# Patient Record
Sex: Male | Born: 1980 | Race: Black or African American | Hispanic: No | Marital: Single | Smoking: Former smoker
Health system: Southern US, Community
[De-identification: ages and names within clinical notes are randomized; demographics above are authoritative.]

## PROBLEM LIST (undated history)

## (undated) DIAGNOSIS — F3110 Bipolar disorder, current episode manic without psychotic features, unspecified: Secondary | ICD-10-CM

## (undated) DIAGNOSIS — T7840XA Allergy, unspecified, initial encounter: Secondary | ICD-10-CM

## (undated) HISTORY — DX: Bipolar disorder, current episode manic without psychotic features, unspecified: F31.10

## (undated) HISTORY — DX: Allergy, unspecified, initial encounter: T78.40XA

---

## 2000-05-12 ENCOUNTER — Ambulatory Visit (HOSPITAL_COMMUNITY): Admission: RE | Admit: 2000-05-12 | Discharge: 2000-05-12 | Payer: Self-pay | Admitting: *Deleted

## 2004-03-11 ENCOUNTER — Inpatient Hospital Stay (HOSPITAL_COMMUNITY): Admission: RE | Admit: 2004-03-11 | Discharge: 2004-03-14 | Payer: Self-pay | Admitting: Psychiatry

## 2004-03-13 ENCOUNTER — Ambulatory Visit: Payer: Self-pay | Admitting: Psychiatry

## 2010-04-01 ENCOUNTER — Emergency Department (HOSPITAL_COMMUNITY)
Admission: EM | Admit: 2010-04-01 | Discharge: 2010-04-01 | Payer: Self-pay | Source: Home / Self Care | Admitting: Family Medicine

## 2010-06-07 ENCOUNTER — Encounter: Payer: Self-pay | Admitting: Psychiatry

## 2010-06-07 ENCOUNTER — Encounter: Payer: Self-pay | Admitting: Family Medicine

## 2010-10-03 NOTE — Discharge Summary (Signed)
NAMEDEANTHONY, MAULL NO.:  [id number]   MEDICAL RECORD NO.:  [id number]          PATIENT TYPE:  IPS   LOCATION:  0403                          FACILITY:  [facility]   PHYSICIAN:  Jeanice Lim, M.D. DATE OF BIRTH:  01/03/1981   DATE OF ADMISSION:  03/11/2004  DATE OF DISCHARGE:  03/14/2004                                 DISCHARGE SUMMARY   IDENTIFYING DATA:  This is a 30 year old single African-American male  voluntarily admitted with bizarre behavior, throwing away items without  asking, exercising and jogging at home, not been sleeping very well for 2-3  weeks with auditory hallucinations.  The patient had been working around the  clock and, again, exercising and jogging.   MEDICATIONS:  Paxil, Ativan, started day prior to admission.   ALLERGIES:  No known drug allergies.   PHYSICAL EXAMINATION:  Physical and neurological exam within normal limits.   LABORATORY DATA:  Routine admission labs within normal limits.   MENTAL STATUS EXAM:  Alert, cooperative, somewhat sleepy, casually dressed.  Poor eye contact.  Speech soft-spoken, nondeliberate.  Mood tired.  Affect  mild irritability and possible suspicious.  Thought processes mostly goal  directed, questionable paranoia, questionable auditory hallucinations and  possible grandiosity.  Cognitive aware of self and situation.  Short-term  memory was impaired.  Poor historian.  Judgment and insight were poor.   ADMISSION DIAGNOSES:   AXIS I:  1.  Psychotic disorder not otherwise specified.  2.  Rule out likely bipolar disorder, type 1, hypomanic to manic state, with      psychotic features.   AXIS II:  Deferred.   AXIS III:  None.   AXIS IV:  Mild to moderate stressors.   AXIS V:  30/65-70.   HOSPITAL COURSE:  The patient was admitted and ordered routine p.r.n.  medications and underwent further monitoring.  Was encouraged to participate  in individual, group and milieu therapy.  The patient was  evaluated and  given medication education.  Risk/benefit ratio and alternative treatments  regarding medications were reviewed and patient was stabilized on  medications.  The patient exhibited some thought-blocking.  Required CIRT  but no restraint.  P.r.n.'s for agitation were given two consecutive days.  Tried to break through door, unpredictable, psychotic, voices, decreased  sleep, mood swings, bizarre behavior.  MRI ordered and neurologic workup,  including B12, folate, TSH, HIV, RPR were all requested due to questionable  first onset of psychosis and mood instability.  Concern of patient being  able to be monitored and managed safely.  Was discussed with treatment team  and treatment team multidisciplined planning continued to work with patient.  The patient was difficult to redirect, threatening physically.  Was quite  threatening to physician as well as several staff members.  Kicked open  doors of unit and reported threatening if he was not to get MRI that had  been ordered immediately that he may break through the glass or permanently  disable a staff member.  The patient could not be reasoned with regarding  MRI.  Explained that this was not urgent.  It was more important to  stabilize him on medications regarding getting him out of the hospital  sooner and patient continued to escalate.  Therefore, after another  interdisciplinary treatment team meeting, it was agreed upon that it was in  the patient's best interest to be transferred to [facility] for a safe  setting for continued medication stabilization in a setting where he was  less likely to be injured and could be maintained due to his degree of  threatening behavior, risk of violence and unpredictable nature and mixed  setting, which was not ideal for this kind of presentation for safe  stabilization.  Therefore, he was transferred to [facility] for further  inpatient stabilization.   CONDITION ON DISCHARGE:  The  patient was discharged in unimproved condition,  to continue medications at the time of transfer for further evaluation at  [facility] and stabilization there.  Therefore, again, the patient was  discharged in unimproved condition.   DISCHARGE DIAGNOSES:   AXIS I:  1.  Psychotic disorder not otherwise specified.  2.  Rule out likely bipolar disorder, type 1, hypomanic to manic state, with      psychotic features.   AXIS II:  Deferred.   AXIS III:  None.   AXIS IV:  Mild to moderate stressors.   AXIS V:  30/65-70.     Jame   JEM/MEDQ  D:  04/13/2004  T:  04/14/2004  Job:  [id number]

## 2010-10-03 NOTE — Procedures (Signed)
[facility]  Patient:    Scott Browning, Scott Browning                      MRN: [id number] Proc. Date: 05/12/00 Adm. Date:  [id number] Attending:  Sabino Gasser                           Procedure Report  PROCEDURE PERFORMED:  Flexible sigmoidoscopy.  ENDOSCOPIST:  Sabino Gasser, M.D.  INDICATIONS:  Rectal bleeding.  ANESTHESIA:  None given at patients request.  DESCRIPTION OF PROCEDURE:  With the patient in the left lateral decubitus position, a rectal exam was performed.  Subsequently, the Olympus videoscopic colonoscope was inserted into the rectum and passed under direct vision to approximately 25 cm from the anal verge at which point the stool that we encountered became impassible, and we stopped.  A photograph was taken.  From this point, the endoscope was slowly withdrawn taking circumferential views of the entire rectal mucosa.  The endoscope was then pulled back into the anal canal area and placed in retroflexion view.  The rectum from above still appeared negative grossly.  The endoscope was straightened and withdrawn through the anal canal which was unremarkable.  The patients vital signs and pulse oximeter remained stable.  The patient tolerated the procedure well without apparent complications.  FINDINGS:  Poor prep precludes thorough exam, but no gross lesion seen.  No hemorrhoids, fissures or other abnormalities at this time.  I told the patient if he had recurrence of bleeding to contact me for potential colonoscopy or exam under sedation with better prep. DD:  05/12/00 TD:  05/12/00 Job: [id number]

## 2012-10-19 ENCOUNTER — Ambulatory Visit (INDEPENDENT_AMBULATORY_CARE_PROVIDER_SITE_OTHER): Payer: BC Managed Care – PPO | Admitting: Physician Assistant

## 2012-10-19 VITALS — BP 120/74 | HR 62 | Temp 98.1°F | Resp 16 | Ht 75.75 in | Wt 221.6 lb

## 2012-10-19 DIAGNOSIS — J019 Acute sinusitis, unspecified: Secondary | ICD-10-CM

## 2012-10-19 DIAGNOSIS — J302 Other seasonal allergic rhinitis: Secondary | ICD-10-CM

## 2012-10-19 DIAGNOSIS — J309 Allergic rhinitis, unspecified: Secondary | ICD-10-CM

## 2012-10-19 MED ORDER — GUAIFENESIN ER 1200 MG PO TB12: 1200.0000 mg | ORAL_TABLET | Freq: Two times a day (BID) | ORAL | Status: DC

## 2012-10-19 MED ORDER — FLUTICASONE PROPIONATE 50 MCG/ACT NA SUSP: 2.0000 | Freq: Every day | NASAL | Status: DC

## 2012-10-19 MED ORDER — AMOXICILLIN 875 MG PO TABS: 1750.0000 mg | ORAL_TABLET | Freq: Two times a day (BID) | ORAL | Status: DC

## 2012-10-19 NOTE — Progress Notes (Signed)
  Subjective:    Patient ID: Scott Browning, male    DOB: 1980-07-06, 32 y.o.   MRN: [id number]  HPI  Presents with nasal congestion, sore throat, cough, sinus pressure x 1.5 weeks and subjective fever and body aches x 2 days. Reports coughing up yellow sputum, which he also blows out of his nose. Denies blood in sputum. Patient reports history of seasonal allergies for the past 4 years but this feels slightly different. Benadryl provided some relief. Denies nausea. Does not take regular allergy medications.  Pt is interested in using nose cones from his research on the internet and wants to see if they are ok.   Review of Systems Denies: headache, vision changes, SOB, chest pain, abdominal pain, urinary symptoms.     Objective:   Physical Exam  General: WDWN male, no apparent distress HEENT: normocephalic, atraumatic, PERLA, conjunctiva clear, TMs clear and visible bony landmarks, turbinates are erythematous and edematous with visible clear discharge, uvula midline, posterior palate is erythematous without edema, neck supple, no lymphadenopathy but submandibular glands are tender to palpation, maxillary sinuses are mildly tender to palpation.   Resp: clear to auscultation in all fields bilaterally without rales/rhonchi/wheezes  Cardiac: RRR, S1S2 appreciated, no murmurs/rubs/gallops Extremities: moves all limbs spontaneously  Skin: no rash      Assessment & Plan:   Acute sinus infection - Plan: Guaifenesin 1200 MG TB12, amoxicillin (AMOXIL) 875 MG tablet  Seasonal allergies - Plan: fluticasone (FLONASE) 50 MCG/ACT nasal spray  I suspect patient has untreated seasonal allergies and therefore developed a sinus infection due to a viral URI.  Pt educated on use of nasal spray for seasonal allergies.  Take entire antibiotic as directed.  Fluids and hydration.  It is ok to use the nasal cone if patient desire.

## 2012-10-19 NOTE — Progress Notes (Signed)
   [address] [place] [address]   Phone [phone]  Subjective:    Patient ID: Scott Browning, male    DOB: 1981-03-25, 32 y.o.   MRN: [id number]  HPI    Review of Systems     Objective:   Physical Exam        Assessment & Plan:  I was directly involved with the patients care and examined and agreed with the assessment and plan.

## 2012-10-19 NOTE — Patient Instructions (Addendum)
Take medications as directed.  Finish entire course of antibiotics as directed.  Drink lots of fluids and stay hydrated.  Return for follow up if you do not improve in 7 days.

## 2012-10-20 ENCOUNTER — Encounter: Payer: Self-pay | Admitting: Physician Assistant

## 2012-10-26 ENCOUNTER — Ambulatory Visit (INDEPENDENT_AMBULATORY_CARE_PROVIDER_SITE_OTHER): Payer: BC Managed Care – PPO | Admitting: Family Medicine

## 2012-10-26 VITALS — BP 132/90 | HR 70 | Temp 98.2°F | Resp 16 | Ht 75.25 in | Wt 214.0 lb

## 2012-10-26 DIAGNOSIS — J019 Acute sinusitis, unspecified: Secondary | ICD-10-CM

## 2012-10-26 DIAGNOSIS — J029 Acute pharyngitis, unspecified: Secondary | ICD-10-CM

## 2012-10-26 DIAGNOSIS — Z113 Encounter for screening for infections with a predominantly sexual mode of transmission: Secondary | ICD-10-CM

## 2012-10-26 MED ORDER — AMOXICILLIN-POT CLAVULANATE 875-125 MG PO TABS: 1.0000 | ORAL_TABLET | Freq: Two times a day (BID) | ORAL | Status: DC

## 2012-10-26 NOTE — Patient Instructions (Signed)

## 2012-10-26 NOTE — Progress Notes (Signed)
 [facility]:  Office Visit  Chief Complaint:  Chief Complaint  Patient presents with  . Follow-up    sinus infection 10/19/12    HPI: Scott Browning is a 32 y.o. male who complains of :  1. Recheck for sinus infection, feels better but still has lump in his throat on left side and also ears feel like water is trickling down it and some nasal congestion. Was see on 6/4 and given amox 875 mg BID x 10 days and flonase. Taking otc benadryl. He has been inside since but would like to go running today and wants to be completely better. No SOB, wheezing.  2. STD screening. History of gonorrhea. Last time he had a bump in his throat and felt this way he was screened for STDs and had gonorrhea, he has not been sexually active either orally or rectally for 1.5 years now.    HPI from prior OV 10/19/12 Presents with nasal congestion, sore throat, cough, sinus pressure x 1.5 weeks and subjective fever and body aches x 2 days. Reports coughing up yellow sputum, which he also blows out of his nose. Denies blood in sputum. Patient reports history of seasonal allergies for the past 4 years but this feels slightly different. Benadryl provided some relief. Denies nausea. Does not take regular allergy medications. Pt is interested in using nose cones from his research on the internet and wants to see if they are ok.   Past Medical History  Diagnosis Date  . Allergy    No past surgical history on file. History   Social History  . Marital Status: Single    Spouse Name: N/A    Number of Children: N/A  . Years of Education: N/A   Social History Main Topics  . Smoking status: Never Smoker   . Smokeless tobacco: None  . Alcohol Use: None  . Drug Use: None  . Sexually Active: None   Other Topics Concern  . None   Social History Narrative  . None   Family History  Problem Relation Age of Onset  . Alzheimer's disease Maternal Grandfather    Allergies  Allergen Reactions  .  Lamictal (Lamotrigine) Rash   Prior to Admission medications   Medication Sig Start Date End Date Taking? Authorizing Provider  diphenhydrAMINE (SOMINEX) 25 MG tablet Take 25 mg by mouth at bedtime as needed for sleep.   Yes Historical Provider, MD  fluticasone (FLONASE) 50 MCG/ACT nasal spray Place 2 sprays into the nose daily. 10/19/12  Yes Morrell Riddle, PA-C  Guaifenesin 1200 MG TB12 Take 1 tablet (1,200 mg total) by mouth 2 (two) times daily. 10/19/12  Yes Morrell Riddle, PA-C  amoxicillin (AMOXIL) 875 MG tablet Take 2 tablets (1,750 mg total) by mouth 2 (two) times daily. 10/19/12   Morrell Riddle, PA-C     ROS: The patient denies fevers, chills, night sweats, unintentional weight loss, chest pain, palpitations, wheezing, dyspnea on exertion, nausea, vomiting, abdominal pain, dysuria, hematuria, melena, numbness, weakness, or tingling.   All other systems have been reviewed and were otherwise negative with the exception of those mentioned in the HPI and as above.    PHYSICAL EXAM: Filed Vitals:   10/26/12 1155  BP: 132/90  Pulse: 70  Temp: 98.2 F (36.8 C)  Resp: 16   Filed Vitals:   10/26/12 1155  Height: 6' 3.25" (1.911 m)  Weight: 214 lb (97.07 kg)   Body mass index is 26.58 kg/(m^2).  General: Alert, no acute distress HEENT:  Normocephalic, atraumatic, oropharynx patent. + boggy nares. + erythema.  Cardiovascular:  Regular rate and rhythm, no rubs murmurs or gallops.  No Carotid bruits, radial pulse intact. No pedal edema.  Respiratory: Clear to auscultation bilaterally.  No wheezes, rales, or rhonchi.  No cyanosis, no use of accessory musculature GI: No organomegaly, abdomen is soft and non-tender, positive bowel sounds.  No masses. Skin: No rashes. Neurologic: Facial musculature symmetric. Psychiatric: Patient is appropriate throughout our interaction. Lymphatic: + small left mobile cervical lymphadenopathy Musculoskeletal: Gait intact.   LABS: No results found for  this or any previous visit.   EKG/XRAY:   Primary read interpreted by Dr. Conley Rolls at [facility].   ASSESSMENT/PLAN: Encounter Diagnoses  Name Primary?  . Screen for STD (sexually transmitted disease) Yes  . Acute sinusitis   . Acute pharyngitis     C/w meds Long discussion about abx use and risk and benefits If no improvement with sxs treatment then take Augmentin DC Bendaryl, take Calritin D/Zyrtec D G/C pending F/u prn   ,  PHUONG, DO 10/26/2012 1:24 PM

## 2012-10-27 LAB — GC/CHLAMYDIA PROBE AMP
CT Probe RNA: NEGATIVE
GC Probe RNA: NEGATIVE

## 2012-10-31 ENCOUNTER — Telehealth: Payer: Self-pay

## 2012-10-31 ENCOUNTER — Ambulatory Visit (INDEPENDENT_AMBULATORY_CARE_PROVIDER_SITE_OTHER): Payer: BC Managed Care – PPO | Admitting: Family Medicine

## 2012-10-31 VITALS — BP 120/83 | HR 80 | Temp 99.3°F | Resp 18 | Wt 216.0 lb

## 2012-10-31 DIAGNOSIS — L0390 Cellulitis, unspecified: Secondary | ICD-10-CM

## 2012-10-31 DIAGNOSIS — L0291 Cutaneous abscess, unspecified: Secondary | ICD-10-CM

## 2012-10-31 LAB — POCT CBC
Granulocyte percent: 74.1 %G (ref 37–80)
HCT, POC: 46.8 % (ref 43.5–53.7)
Hemoglobin: 15 g/dL (ref 14.1–18.1)
Lymph, poc: 2 (ref 0.6–3.4)
MCH, POC: 29.5 pg (ref 27–31.2)
MCHC: 32.1 g/dL (ref 31.8–35.4)
MCV: 92.2 fL (ref 80–97)
MID (cbc): 0.7 (ref 0–0.9)
MPV: 10 fL (ref 0–99.8)
POC Granulocyte: 7.7 — AB (ref 2–6.9)
POC LYMPH PERCENT: 19.3 %L (ref 10–50)
POC MID %: 6.6 %M (ref 0–12)
Platelet Count, POC: 185 10*3/uL (ref 142–424)
RBC: 5.08 M/uL (ref 4.69–6.13)
RDW, POC: 13.6 %
WBC: 10.4 10*3/uL — AB (ref 4.6–10.2)

## 2012-10-31 MED ORDER — CEPHALEXIN 500 MG PO CAPS: 500.0000 mg | ORAL_CAPSULE | Freq: Four times a day (QID) | ORAL | Status: DC

## 2012-10-31 MED ORDER — CEFTRIAXONE SODIUM 1 G IJ SOLR
1.0000 g | Freq: Once | INTRAMUSCULAR | Status: AC
  Administered 2012-10-31: 1 g via INTRAMUSCULAR

## 2012-10-31 NOTE — Patient Instructions (Addendum)
Cellulitis Cellulitis is an infection of the skin and the tissue beneath it. The infected area is usually red and tender. Cellulitis occurs most often in the arms and lower legs.  CAUSES  Cellulitis is caused by bacteria that enter the skin through cracks or cuts in the skin. The most common types of bacteria that cause cellulitis are Staphylococcus and Streptococcus. SYMPTOMS   Redness and warmth.  Swelling.  Tenderness or pain.  Fever. DIAGNOSIS  Your caregiver can usually determine what is wrong based on a physical exam. Blood tests may also be done. TREATMENT  Treatment usually involves taking an antibiotic medicine. HOME CARE INSTRUCTIONS   Take your antibiotics as directed. Finish them even if you start to feel better.  Keep the infected arm or leg elevated to reduce swelling.  Apply a warm cloth to the affected area up to 4 times per day to relieve pain.  Only take over-the-counter or prescription medicines for pain, discomfort, or fever as directed by your caregiver.  Keep all follow-up appointments as directed by your caregiver. SEEK MEDICAL CARE IF:   You notice red streaks coming from the infected area.  Your red area gets larger or turns dark in color.  Your bone or joint underneath the infected area becomes painful after the skin has healed.  Your infection returns in the same area or another area.  You notice a swollen bump in the infected area.  You develop new symptoms. SEEK IMMEDIATE MEDICAL CARE IF:   You have a fever.  You feel very sleepy.  You develop vomiting or diarrhea.  You have a general ill feeling (malaise) with muscle aches and pains. MAKE SURE YOU:   Understand these instructions.  Will watch your condition.  Will get help right away if you are not doing well or get worse. Document Released: 02/11/2005 Document Revised: 11/03/2011 Document Reviewed: 07/20/2011 [employer] Patient Information 2014 [employer].  

## 2012-10-31 NOTE — Progress Notes (Signed)
[facility]:  Office Visit  Chief Complaint:  Chief Complaint  Patient presents with  . Foot Swelling    HPI: Scott Browning is a 32 y.o. male who complains of  Left foot pain starting 2 days ago, he had a cut which he got from [employer] while he was shopping 3 days ago after something fell at the cash register and cut him. He got the cut on Saturday. He then got into a hot tub. 2 days after that his foot started hurting and he had foot swelling.  He has had fevers and chills. 2-3 days after he got his cut he went into the hot tub. Of pertinent interest he is on Augmentin for residual sinusitis sxs. He also played tennis but denies injuring himself. No history of gout.  Past Medical History  Diagnosis Date  . Allergy    No past surgical history on file. History   Social History  . Marital Status: Single    Spouse Name: N/A    Number of Children: N/A  . Years of Education: N/A   Social History Main Topics  . Smoking status: Never Smoker   . Smokeless tobacco: Not on file  . Alcohol Use: Not on file  . Drug Use: Not on file  . Sexually Active: Not on file   Other Topics Concern  . Not on file   Social History Narrative  . No narrative on file   Family History  Problem Relation Age of Onset  . Alzheimer's disease Maternal Grandfather    Allergies  Allergen Reactions  . Lamictal (Lamotrigine) Rash   Prior to Admission medications   Medication Sig Start Date End Date Taking? Authorizing Provider  amoxicillin-clavulanate (AUGMENTIN) 875-125 MG per tablet Take 1 tablet by mouth 2 (two) times daily. 10/26/12  Yes Mayra Brahm P Shyra Emile, DO  fluticasone (FLONASE) 50 MCG/ACT nasal spray Place 2 sprays into the nose daily. 10/19/12  Yes Morrell Riddle, PA-C  diphenhydrAMINE (SOMINEX) 25 MG tablet Take 25 mg by mouth at bedtime as needed for sleep.    Historical Provider, MD  Guaifenesin 1200 MG TB12 Take 1 tablet (1,200 mg total) by mouth 2 (two) times daily. 10/19/12   Morrell Riddle, PA-C     ROS: The patient denies night sweats, unintentional weight loss, chest pain, palpitations, wheezing, dyspnea on exertion, nausea, vomiting, abdominal pain, dysuria, hematuria, melena, numbness, weakness, or tingling.   All other systems have been reviewed and were otherwise negative with the exception of those mentioned in the HPI and as above.    PHYSICAL EXAM: Filed Vitals:   10/31/12 1157  BP: 120/83  Pulse: 80  Temp: 99.3 F (37.4 C)  Resp: 18   Filed Vitals:   10/31/12 1157  Weight: 216 lb (97.977 kg)   Body mass index is 26.83 kg/(m^2).  General: Alert, no acute distress HEENT:  Normocephalic, atraumatic, oropharynx patent.  Cardiovascular:  Regular rate and rhythm, no rubs murmurs or gallops.  No Carotid bruits, radial pulse intact. No pedal edema.  Respiratory: Clear to auscultation bilaterally.  No wheezes, rales, or rhonchi.  No cyanosis, no use of accessory musculature GI: No organomegaly, abdomen is soft and non-tender, positive bowel sounds.  No masses. Skin: No rashes. Neurologic: Facial musculature symmetric. Psychiatric: Patient is appropriate throughout our interaction. Lymphatic: No cervical lymphadenopathy Musculoskeletal: limp + left foot swelling, redness, warmth. + laceration healed at achilles tendon, swelling and redness does not really go past ankle, starts  from dorsum to top of ankle.  + tibial artery, good cap reill, 5/5 stregnth, full ROm but pain with walking   LABS: Results for orders placed in visit on 10/31/12  POCT CBC      Result Value Range   WBC 10.4 (*) 4.6 - 10.2 K/uL   Lymph, poc 2.0  0.6 - 3.4   POC LYMPH PERCENT 19.3  10 - 50 %L   MID (cbc) 0.7  0 - 0.9   POC MID % 6.6  0 - 12 %M   POC Granulocyte 7.7 (*) 2 - 6.9   Granulocyte percent 74.1  37 - 80 %G   RBC 5.08  4.69 - 6.13 M/uL   Hemoglobin 15.0  14.1 - 18.1 g/dL   HCT, POC 16.1  09.6 - 53.7 %   MCV 92.2  80 - 97 fL   MCH, POC 29.5  27 - 31.2 pg   MCHC  32.1  31.8 - 35.4 g/dL   RDW, POC 04.5     Platelet Count, POC 185  142 - 424 K/uL   MPV 10.0  0 - 99.8 fL     EKG/XRAY:   Primary read interpreted by Dr. Conley Rolls at [facility].   ASSESSMENT/PLAN: Encounter Diagnosis  Name Primary?  . Cellulitis Yes   I do not think this is gout, I also don;t think he had ay sprains or strain from tennis Most likely a cellulitis that he got from cut from back of heel and then he soaked it in the hot tub He is not worse off since he was already on Augmentin for sinusitis sxs Was given Rocephin 1 gram IM in office DC Augmentin Start Keflex 500 mg QID x 10 days Work note for 3 days off, RTW on 6/18  F/u by phone on Wednesday AM     Azaleah Usman PHUONG, DO 10/31/2012 1:13 PM

## 2012-10-31 NOTE — Telephone Encounter (Signed)
PT STATES HE HAD TESTS DONE ON THE 11TH AND HASN'T HEARD FROM ANYONE. PLEASE CALL [phone] WAS SEEN BY DR LE

## 2012-11-01 NOTE — Telephone Encounter (Signed)
Advised pt of results. No questions/concerns.

## 2012-11-02 ENCOUNTER — Ambulatory Visit (INDEPENDENT_AMBULATORY_CARE_PROVIDER_SITE_OTHER): Payer: BC Managed Care – PPO | Admitting: Family Medicine

## 2012-11-02 ENCOUNTER — Ambulatory Visit: Payer: BC Managed Care – PPO

## 2012-11-02 ENCOUNTER — Telehealth: Payer: Self-pay | Admitting: Family Medicine

## 2012-11-02 VITALS — BP 122/78 | HR 66 | Temp 97.9°F | Resp 16 | Ht 75.25 in | Wt 218.0 lb

## 2012-11-02 DIAGNOSIS — M25572 Pain in left ankle and joints of left foot: Secondary | ICD-10-CM

## 2012-11-02 DIAGNOSIS — M25579 Pain in unspecified ankle and joints of unspecified foot: Secondary | ICD-10-CM

## 2012-11-02 DIAGNOSIS — L03116 Cellulitis of left lower limb: Secondary | ICD-10-CM

## 2012-11-02 DIAGNOSIS — L02619 Cutaneous abscess of unspecified foot: Secondary | ICD-10-CM

## 2012-11-02 LAB — POCT CBC
Granulocyte percent: 60.8 % (ref 37–80)
HCT, POC: 42.2 % — AB (ref 43.5–53.7)
Hemoglobin: 13.4 g/dL — AB (ref 14.1–18.1)
Lymph, poc: 1.9 (ref 0.6–3.4)
MCH, POC: 29.1 pg (ref 27–31.2)
MCHC: 31.8 g/dL (ref 31.8–35.4)
MCV: 91.8 fL (ref 80–97)
MID (cbc): 0.5 (ref 0–0.9)
MPV: 9.4 fL (ref 0–99.8)
POC Granulocyte: 3.6 (ref 2–6.9)
POC LYMPH PERCENT: 31.1 %L (ref 10–50)
POC MID %: 8.1 % (ref 0–12)
Platelet Count, POC: 208 10*3/uL (ref 142–424)
RBC: 4.6 M/uL — AB (ref 4.69–6.13)
RDW, POC: 13.6 %
WBC: 6 10*3/uL (ref 4.6–10.2)

## 2012-11-02 LAB — POCT SEDIMENTATION RATE: POCT SED RATE: 45 mm/hr — AB (ref 0–22)

## 2012-11-02 LAB — URIC ACID: Uric Acid, Serum: 5.1 mg/dL (ref 4.0–7.8)

## 2012-11-02 NOTE — Progress Notes (Signed)
[facility]:  Office Visit  Chief Complaint:  Chief Complaint  Patient presents with  . Follow-up    cellulitis Lt foot    HPI: Scott Browning is a 32 y.o. male who complains of  Here for recheck of left foot, not better. On 2 rounds of abx Augmentin, then got rocephin and keflex for last 48 hrs and not better.  No fevers, chills. Same sxs but slightly more swollen because has been using it more and being on his feet. He had errands to do.   Prior HPI: Scott Browning is a 32 y.o. male who complains of Left foot pain starting 2 days ago, he had a cut which he got from [employer] while he was shopping 3 days ago after something fell at the cash register and cut him. He got the cut on Saturday. He then got into a hot tub. 2 days after that his foot started hurting and he had foot swelling.  He has had fevers and chills. 2-3 days after he got his cut he went into the hot tub. Of pertinent interest he is on Augmentin for residual sinusitis sxs. He also played tennis but denies injuring himself. No history of gout.   Past Medical History  Diagnosis Date  . Allergy    No past surgical history on file. History   Social History  . Marital Status: Single    Spouse Name: N/A    Number of Children: N/A  . Years of Education: N/A   Social History Main Topics  . Smoking status: Never Smoker   . Smokeless tobacco: None  . Alcohol Use: None  . Drug Use: None  . Sexually Active: None   Other Topics Concern  . None   Social History Narrative  . None   Family History  Problem Relation Age of Onset  . Alzheimer's disease Maternal Grandfather    Allergies  Allergen Reactions  . Lamictal (Lamotrigine) Rash   Prior to Admission medications   Medication Sig Start Date End Date Taking? Authorizing Provider  cephALEXin (KEFLEX) 500 MG capsule Take 1 capsule (500 mg total) by mouth 4 (four) times daily. 10/31/12  Yes Thao P Le, DO  diphenhydrAMINE (SOMINEX) 25 MG tablet  Take 25 mg by mouth at bedtime as needed for sleep.    Historical Provider, MD  fluticasone (FLONASE) 50 MCG/ACT nasal spray Place 2 sprays into the nose daily. 10/19/12   Morrell Riddle, PA-C  Guaifenesin 1200 MG TB12 Take 1 tablet (1,200 mg total) by mouth 2 (two) times daily. 10/19/12   Morrell Riddle, PA-C     ROS: The patient denies fevers, chills, night sweats, unintentional weight loss, chest pain, palpitations, wheezing, dyspnea on exertion, nausea, vomiting, abdominal pain, dysuria, hematuria, melena, numbness, weakness, or tingling.   All other systems have been reviewed and were otherwise negative with the exception of those mentioned in the HPI and as above.    PHYSICAL EXAM: Filed Vitals:   11/02/12 1021  BP: 122/78  Pulse: 66  Temp: 97.9 F (36.6 C)  Resp: 16   Filed Vitals:   11/02/12 1021  Height: 6' 3.25" (1.911 m)  Weight: 218 lb (98.884 kg)   Body mass index is 27.08 kg/(m^2).  General: Alert, no acute distress HEENT:  Normocephalic, atraumatic, oropharynx patent.  Cardiovascular:  Regular rate and rhythm, no rubs murmurs or gallops.  No Carotid bruits, radial pulse intact. No pedal edema.  Respiratory: Clear to auscultation bilaterally.  No wheezes, rales, or rhonchi.  No cyanosis, no use of accessory musculature GI: No organomegaly, abdomen is soft and non-tender, positive bowel sounds.  No masses. Skin: No rashes. Neurologic: Facial musculature symmetric. Psychiatric: Patient is appropriate throughout our interaction. Lymphatic: No cervical lymphadenopathy Musculoskeletal: Gait intact. + foot swelling left latera and dorsum of foot, and redness, and warmth, + DP Good ROM, 5/5 strength  LABS: Results for orders placed in visit on 11/02/12  POCT CBC      Result Value Range   WBC 6.0  4.6 - 10.2 K/uL   Lymph, poc 1.9  0.6 - 3.4   POC LYMPH PERCENT 31.1  10 - 50 %L   MID (cbc) 0.5  0 - 0.9   POC MID % 8.1  0 - 12 %M   POC Granulocyte 3.6  2 - 6.9    Granulocyte percent 60.8  37 - 80 %G   RBC 4.60 (*) 4.69 - 6.13 M/uL   Hemoglobin 13.4 (*) 14.1 - 18.1 g/dL   HCT, POC 95.6 (*) 21.3 - 53.7 %   MCV 91.8  80 - 97 fL   MCH, POC 29.1  27 - 31.2 pg   MCHC 31.8  31.8 - 35.4 g/dL   RDW, POC 08.6     Platelet Count, POC 208  142 - 424 K/uL   MPV 9.4  0 - 99.8 fL     EKG/XRAY:   Primary read interpreted by Dr. Conley Rolls at [facility]. No fractures or dislocation of ankle or foot + soft tissue swelling  ASSESSMENT/PLAN: Encounter Diagnoses  Name Primary?  . Cellulitis of foot, left Yes  . Pain in joint, ankle and foot, left    ESR, uric acid pending Xrays are normal Will wait for another 24 hrs since no better but not worse F/u by phone in 1 day Elevate, warm compresses Go to ER prn   LE, THAO PHUONG, DO 11/02/2012 11:41 AM

## 2012-11-02 NOTE — Telephone Encounter (Signed)
Spoke to patient about labs and xray results

## 2012-11-23 ENCOUNTER — Ambulatory Visit (INDEPENDENT_AMBULATORY_CARE_PROVIDER_SITE_OTHER): Payer: BC Managed Care – PPO | Admitting: Family Medicine

## 2012-11-23 VITALS — BP 115/70 | HR 56 | Temp 98.8°F | Resp 16 | Ht 75.0 in | Wt 223.0 lb

## 2012-11-23 DIAGNOSIS — L0291 Cutaneous abscess, unspecified: Secondary | ICD-10-CM

## 2012-11-23 DIAGNOSIS — M79609 Pain in unspecified limb: Secondary | ICD-10-CM

## 2012-11-23 DIAGNOSIS — M79675 Pain in left toe(s): Secondary | ICD-10-CM

## 2012-11-23 DIAGNOSIS — L0390 Cellulitis, unspecified: Secondary | ICD-10-CM

## 2012-11-23 MED ORDER — INDOMETHACIN 50 MG PO CAPS: 50.0000 mg | ORAL_CAPSULE | Freq: Two times a day (BID) | ORAL | Status: DC

## 2012-11-23 NOTE — Progress Notes (Signed)
[facility]:  Office Visit  Chief Complaint:  Chief Complaint  Patient presents with  . Follow-up    left foot, cellulitis    HPI: Scott Browning is a 32 y.o. male who complains of: 1. Here for cellulitis recheck. Doing better. Has some skin peeling on his foot.He was on amoxacillin and then followed by Keflex.  2. He has a 1 1/2 week history of toe pain at the fist MTP of left great toe. H/o gout. Pain and especially when he is sleeping. No new footwear. No redness, warmth, fevers.   Past Medical History  Diagnosis Date  . Allergy    No past surgical history on file. History   Social History  . Marital Status: Single    Spouse Name: N/A    Number of Children: N/A  . Years of Education: N/A   Social History Main Topics  . Smoking status: Never Smoker   . Smokeless tobacco: Not on file  . Alcohol Use: Not on file  . Drug Use: Not on file  . Sexually Active: Not on file   Other Topics Concern  . Not on file   Social History Narrative  . No narrative on file   Family History  Problem Relation Age of Onset  . Alzheimer's disease Maternal Grandfather    Allergies  Allergen Reactions  . Lamictal (Lamotrigine) Rash   Prior to Admission medications   Medication Sig Start Date End Date Taking? Authorizing Provider  fluticasone (FLONASE) 50 MCG/ACT nasal spray Place 2 sprays into the nose daily. 10/19/12  Yes Morrell Riddle, PA-C  cephALEXin (KEFLEX) 500 MG capsule Take 1 capsule (500 mg total) by mouth 4 (four) times daily. 10/31/12   Sheriff Rodenberg P Anselmo Reihl, DO  diphenhydrAMINE (SOMINEX) 25 MG tablet Take 25 mg by mouth at bedtime as needed for sleep.    Historical Provider, MD  Guaifenesin 1200 MG TB12 Take 1 tablet (1,200 mg total) by mouth 2 (two) times daily. 10/19/12   Morrell Riddle, PA-C     ROS: The patient denies fevers, chills, night sweats, unintentional weight loss, chest pain, palpitations, wheezing, dyspnea on exertion, nausea, vomiting, abdominal pain,  dysuria, hematuria, melena, numbness, weakness, or tingling.   All other systems have been reviewed and were otherwise negative with the exception of those mentioned in the HPI and as above.    PHYSICAL EXAM: Filed Vitals:   11/23/12 1156  BP: 115/70  Pulse: 56  Temp: 98.8 F (37.1 C)  Resp: 16   Filed Vitals:   11/23/12 1156  Height: 6\' 3"  (1.905 m)  Weight: 223 lb (101.152 kg)   Body mass index is 27.87 kg/(m^2).  General: Alert, no acute distress HEENT:  Normocephalic, atraumatic, oropharynx patent.  Cardiovascular:  Regular rate and rhythm, no rubs murmurs or gallops.  No Carotid bruits, radial pulse intact. No pedal edema.  Respiratory: Clear to auscultation bilaterally.  No wheezes, rales, or rhonchi.  No cyanosis, no use of accessory musculature GI: No organomegaly, abdomen is soft and non-tender, positive bowel sounds.  No masses. Skin: No rashes. Neurologic: Facial musculature symmetric. Psychiatric: Patient is appropriate throughout our interaction. Lymphatic: No cervical lymphadenopathy Musculoskeletal: Gait intact. Left big toe-tender at lateral MTP, full ROM, 5/5 stength, no warmth, minimal redness Cellulitis resolved, he does have some skin peeling but not fungal  LABS: Results for orders placed in visit on 11/02/12  URIC ACID      Result Value Range   Uric Acid,  Serum 5.1  4.0 - 7.8 mg/dL  POCT CBC      Result Value Range   WBC 6.0  4.6 - 10.2 K/uL   Lymph, poc 1.9  0.6 - 3.4   POC LYMPH PERCENT 31.1  10 - 50 %L   MID (cbc) 0.5  0 - 0.9   POC MID % 8.1  0 - 12 %M   POC Granulocyte 3.6  2 - 6.9   Granulocyte percent 60.8  37 - 80 %G   RBC 4.60 (*) 4.69 - 6.13 M/uL   Hemoglobin 13.4 (*) 14.1 - 18.1 g/dL   HCT, POC 41.3 (*) 24.4 - 53.7 %   MCV 91.8  80 - 97 fL   MCH, POC 29.1  27 - 31.2 pg   MCHC 31.8  31.8 - 35.4 g/dL   RDW, POC 01.0     Platelet Count, POC 208  142 - 424 K/uL   MPV 9.4  0 - 99.8 fL  POCT SEDIMENTATION RATE      Result Value Range    POCT SED RATE 45 (*) 0 - 22 mm/hr     EKG/XRAY:   Primary read interpreted by Dr. Conley Rolls at [facility].   ASSESSMENT/PLAN: Encounter Diagnoses  Name Primary?  . Toe pain, left Yes  . Cellulitis    Cellulitis improved, resolved Rx Indomethacine x 5 days for ? Gout vs just swelling from tight foot wear Will  get uric acid. F/u prn   Taliah Porche PHUONG, DO 11/23/2012 12:28 PM

## 2012-11-24 LAB — URIC ACID: Uric Acid, Serum: 5.9 mg/dL (ref 4.0–7.8)

## 2013-02-25 ENCOUNTER — Encounter: Payer: Self-pay | Admitting: Physician Assistant

## 2013-02-25 ENCOUNTER — Ambulatory Visit (INDEPENDENT_AMBULATORY_CARE_PROVIDER_SITE_OTHER): Payer: BC Managed Care – PPO | Admitting: Physician Assistant

## 2013-02-25 VITALS — BP 144/86 | HR 90 | Temp 99.2°F | Resp 20 | Ht 75.25 in | Wt 216.4 lb

## 2013-02-25 DIAGNOSIS — G4700 Insomnia, unspecified: Secondary | ICD-10-CM

## 2013-02-25 DIAGNOSIS — F319 Bipolar disorder, unspecified: Secondary | ICD-10-CM

## 2013-02-25 MED ORDER — ZOLPIDEM TARTRATE 10 MG PO TABS: 10.0000 mg | ORAL_TABLET | Freq: Every evening | ORAL | Status: DC | PRN

## 2013-02-25 NOTE — Progress Notes (Signed)
   [address], [place] [address]   Phone [phone]   Subjective:    Patient ID: Scott Browning, male    DOB: 07-09-1980, 32 y.o.   MRN: [id number]  HPI  Pt presents to clinic with the need for sleep aid.  He has h/o bioplar and was diagnosed in 2005 and put on medication but he was not interested in being on meds daily and he and his pyschiatrist decided that because his mania was infrequent and he did not like to be on medication that he could stop the meds.  Then in 2011 he started seeing Dr Inda Merlin during a manic episode and she started him on Lamictal but he had a terrible rash and he again got scared of meds.  At that time she put him on Ambien to use during his mania phases.  He has not used any since 2011 because he typically just deals with his mania which lasts only about 1.5-2 weeks and being tired because he can get a lot done.  He is concerned this time because he is starting a new job in 4 days and he has been manic for about 4 days and he is concerned that his focus will be decreased because of lack of sleep and he wants to make a good impression.  He does not suffer from depression symptoms when he is not manic and during his mania he does not do anything that could cause harm to himself.  His main symptoms is that he does not need sleep.  His last episode of mania was in June and he states that he gets them maybe 2x /year and they have decreased substantial since his diagnosis.   Review of Systems  Psychiatric/Behavioral: Positive for sleep disturbance. Negative for dysphoric mood.       Objective:   Physical Exam  Vitals reviewed. Constitutional: He is oriented to person, place, and time. He appears well-developed and well-nourished.  HENT:  Head: Normocephalic and atraumatic.  Right Ear: External ear normal.  Left Ear: External ear normal.  Pulmonary/Chest: Effort normal.  Neurological: He is alert and oriented to person, place, and time.  Skin: Skin is warm and  dry.  Psychiatric: He has a normal mood and affect. His behavior is normal. Judgment and thought content normal. His speech is rapid and/or pressured (slightly rapid but not pressured). Cognition and memory are normal.        Assessment & Plan:  Bipolar disorder, unspecified  Insomnia - Plan: zolpidem (AMBIEN) 10 MG tablet  D/w pt that I will give him #15 pills due to his new job - we discussed that this is not the treatment for mania associated with bipolar but due to his concerns regarding long term medications and his infrequent mild symptoms I will do it this time but if he should notice increase in symptoms or needing Ambien more frequently he will need a psychiatrist.  He voiced understanding.  Benny Lennert PA-C 02/25/2013 7:01 PM

## 2014-05-25 ENCOUNTER — Encounter: Payer: Self-pay | Admitting: Family Medicine

## 2014-05-25 ENCOUNTER — Ambulatory Visit (INDEPENDENT_AMBULATORY_CARE_PROVIDER_SITE_OTHER): Payer: BLUE CROSS/BLUE SHIELD | Admitting: Family Medicine

## 2014-05-25 VITALS — BP 143/85 | HR 89 | Temp 98.5°F | Resp 16 | Ht 75.5 in | Wt 238.8 lb

## 2014-05-25 DIAGNOSIS — J0100 Acute maxillary sinusitis, unspecified: Secondary | ICD-10-CM

## 2014-05-25 DIAGNOSIS — J302 Other seasonal allergic rhinitis: Secondary | ICD-10-CM

## 2014-05-25 MED ORDER — LORATADINE 10 MG PO TABS: 10.0000 mg | ORAL_TABLET | Freq: Every day | ORAL | Status: DC

## 2014-05-25 MED ORDER — FLUTICASONE PROPIONATE 50 MCG/ACT NA SUSP: 2.0000 | Freq: Every day | NASAL | Status: DC

## 2014-05-25 MED ORDER — AMOXICILLIN-POT CLAVULANATE 875-125 MG PO TABS: 1.0000 | ORAL_TABLET | Freq: Two times a day (BID) | ORAL | Status: DC

## 2014-05-25 NOTE — Progress Notes (Signed)
Subjective:    Patient ID: Scott Browning, male    DOB: 10/05/1980, 34 y.o.   MRN: [id number]  PCP: Georgianne FickAMACHANDRAN,AJITH, MD  Chief Complaint  Patient presents with  . Sinusitis    10 days   Patient Active Problem List   Diagnosis Date Noted  . Bipolar disorder, unspecified 02/25/2013   Prior to Admission medications   Not on File   Medications, allergies, past medical history, surgical history, family history, social history and problem list reviewed and updated.  HPI  [age] yom with no sig pmh presents with uri sx.  He has been having rhinorrhea past 2 wks. Purulent. Prod cough past couple wks with white/yellow sputum. Occasional blood in cough. His teeth have felt painful last couple days. Denies sore throat, abd pain, N/V, diarrhea. Denies HA. Denies otalgia. Denies fever, chills.   Was seen here 1.5 yrs ago with similar sx, diagnosed with sinusitis. Amox worked. Was told to start flonase/claririn-d for allergies.   Today he mentions he has had year round allergies past couple yrs, worse when seasons change. He is not taking the flonase or antihistamine.   Diag with sinus infx 2 yrs ago. Same sx coming back. Drainage, coughing up phlegm. Came in cuz teeth feel sensitive now past couple days. Same sx as last time had full blown sinus infx. He is around a smoker daily and feels like this irritates his throat.   Review of Systems No CP, SOB, fever, chills.     Objective:   Physical Exam  Constitutional: He is oriented to person, place, and time. He appears well-developed and well-nourished.  Non-toxic appearance. He does not have a sickly appearance. He does not appear ill. No distress.  BP 143/85 mmHg  Pulse 89  Temp(Src) 98.5 F (36.9 C) (Oral)  Resp 16  Ht 6' 3.5" (1.918 m)  Wt 238 lb 12.8 oz (108.319 kg)  BMI 29.44 kg/m2  SpO2 98%   HENT:  Right Ear: Tympanic membrane normal.  Left Ear: Tympanic membrane normal.  Nose: No mucosal edema or rhinorrhea. Right sinus  exhibits no maxillary sinus tenderness and no frontal sinus tenderness. Left sinus exhibits maxillary sinus tenderness. Left sinus exhibits no frontal sinus tenderness.  Mouth/Throat: Uvula is midline, oropharynx is clear and moist and mucous membranes are normal. No oropharyngeal exudate, posterior oropharyngeal edema, posterior oropharyngeal erythema or tonsillar abscesses.  Mild-mod TTP left maxillary sinus.   Eyes: Conjunctivae are normal.  Cardiovascular: Normal rate, regular rhythm and normal heart sounds.  Exam reveals no gallop.   No murmur heard. Pulmonary/Chest: Effort normal and breath sounds normal. No tachypnea. He has no decreased breath sounds. He has no wheezes. He has no rhonchi. He has no rales.  Lymphadenopathy:       Head (right side): No submental, no submandibular and no tonsillar adenopathy present.       Head (left side): No submental, no submandibular and no tonsillar adenopathy present.    He has no cervical adenopathy.  Neurological: He is alert and oriented to person, place, and time.      Assessment & Plan:   [age] yom with no sig pmh presents with uri sx.  Acute maxillary sinusitis, recurrence not specified - Plan: amoxicillin-clavulanate (AUGMENTIN) 875-125 MG per tablet --prod cough, rhinorrhea for 2 wks, ttp left maxillary sinus today --augmentin  Seasonal allergies - Plan: fluticasone (FLONASE) 50 MCG/ACT nasal spray, loratadine (CLARITIN) 10 MG tablet --could be contributing to sx --flonase instructions given --claritin --avoidance of cigarette  smoke  Donnajean Lopes, PA-C Physician Assistant-Certified [facility]  05/25/2014 10:32 AM

## 2014-05-25 NOTE — Patient Instructions (Signed)
Your symptoms are most likely due to your allergies. Please take the claritin once daily. Please use the flonase 2 sprays in each nostril once daily.  It would benefit you to try to stay away from irritating smoke and perfumes as much as possible.  We'll write for an antibiotic. If you don't feel better in a few days or feel yourself getting worse, please begin the antibiotic. Please return to clinic if you don't feel that the allergy medicines and antibiotic are helping.

## 2014-05-28 NOTE — Progress Notes (Signed)
Subjective:    Patient ID: Scott Browning, male    DOB: 10/05/1980, 34 y.o.   MRN: [id number]  PCP: Georgianne FickAMACHANDRAN,AJITH, MD  Chief Complaint  Patient presents with  . Sinusitis    10 days   Patient Active Problem List   Diagnosis Date Noted  . Bipolar disorder, unspecified 02/25/2013   Prior to Admission medications   Not on File   Medications, allergies, past medical history, surgical history, family history, social history and problem list reviewed and updated.  HPI  [age] yom with no sig pmh presents with uri sx.  He has been having rhinorrhea past 2 wks. Purulent. Prod cough past couple wks with white/yellow sputum. Occasional blood in cough. His teeth have felt painful last couple days. Denies sore throat, abd pain, N/V, diarrhea. Denies HA. Denies otalgia. Denies fever, chills.   Was seen here 1.5 yrs ago with similar sx, diagnosed with sinusitis. Amox worked. Was told to start flonase/claririn-d for allergies.   Today he mentions he has had year round allergies past couple yrs, worse when seasons change. He is not taking the flonase or antihistamine.   Diag with sinus infx 2 yrs ago. Same sx coming back. Drainage, coughing up phlegm. Came in cuz teeth feel sensitive now past couple days. Same sx as last time had full blown sinus infx. He is around a smoker daily and feels like this irritates his throat.   Review of Systems No CP, SOB, fever, chills.     Objective:   Physical Exam  Constitutional: He is oriented to person, place, and time. He appears well-developed and well-nourished.  Non-toxic appearance. He does not have a sickly appearance. He does not appear ill. No distress.  BP 143/85 mmHg  Pulse 89  Temp(Src) 98.5 F (36.9 C) (Oral)  Resp 16  Ht 6' 3.5" (1.918 m)  Wt 238 lb 12.8 oz (108.319 kg)  BMI 29.44 kg/m2  SpO2 98%   HENT:  Right Ear: Tympanic membrane normal.  Left Ear: Tympanic membrane normal.  Nose: No mucosal edema or rhinorrhea. Right sinus  exhibits no maxillary sinus tenderness and no frontal sinus tenderness. Left sinus exhibits maxillary sinus tenderness. Left sinus exhibits no frontal sinus tenderness.  Mouth/Throat: Uvula is midline, oropharynx is clear and moist and mucous membranes are normal. No oropharyngeal exudate, posterior oropharyngeal edema, posterior oropharyngeal erythema or tonsillar abscesses.  Mild-mod TTP left maxillary sinus.   Eyes: Conjunctivae are normal.  Cardiovascular: Normal rate, regular rhythm and normal heart sounds.  Exam reveals no gallop.   No murmur heard. Pulmonary/Chest: Effort normal and breath sounds normal. No tachypnea. He has no decreased breath sounds. He has no wheezes. He has no rhonchi. He has no rales.  Lymphadenopathy:       Head (right side): No submental, no submandibular and no tonsillar adenopathy present.       Head (left side): No submental, no submandibular and no tonsillar adenopathy present.    He has no cervical adenopathy.  Neurological: He is alert and oriented to person, place, and time.      Assessment & Plan:   [age] yom with no sig pmh presents with uri sx.  Acute maxillary sinusitis, recurrence not specified - Plan: amoxicillin-clavulanate (AUGMENTIN) 875-125 MG per tablet --prod cough, rhinorrhea for 2 wks, ttp left maxillary sinus today --augmentin  Seasonal allergies - Plan: fluticasone (FLONASE) 50 MCG/ACT nasal spray, loratadine (CLARITIN) 10 MG tablet --could be contributing to sx --flonase instructions given --claritin --avoidance of cigarette  smoke  Donnajean Lopesodd M. McVeigh, PA-C Physician Assistant-Certified [facility]  05/25/2014 10:32 AM     Agree with A/P . Patient was also seen and examined by me. Dr Conley RollsLe

## 2014-12-12 ENCOUNTER — Other Ambulatory Visit: Payer: Self-pay | Admitting: *Deleted

## 2014-12-12 DIAGNOSIS — I83893 Varicose veins of bilateral lower extremities with other complications: Secondary | ICD-10-CM

## 2014-12-16 ENCOUNTER — Ambulatory Visit (INDEPENDENT_AMBULATORY_CARE_PROVIDER_SITE_OTHER): Payer: BLUE CROSS/BLUE SHIELD | Admitting: Family Medicine

## 2014-12-16 VITALS — BP 108/66 | HR 54 | Temp 98.5°F | Resp 14 | Ht 76.0 in | Wt 237.6 lb

## 2014-12-16 DIAGNOSIS — Z113 Encounter for screening for infections with a predominantly sexual mode of transmission: Secondary | ICD-10-CM

## 2014-12-16 DIAGNOSIS — Z13220 Encounter for screening for lipoid disorders: Secondary | ICD-10-CM

## 2014-12-16 DIAGNOSIS — Z0000 Encounter for general adult medical examination without abnormal findings: Secondary | ICD-10-CM | POA: Diagnosis not present

## 2014-12-16 DIAGNOSIS — Z130 Encounter for screening for diseases of the blood and blood-forming organs and certain disorders involving the immune mechanism: Secondary | ICD-10-CM

## 2014-12-16 DIAGNOSIS — Z1329 Encounter for screening for other suspected endocrine disorder: Secondary | ICD-10-CM | POA: Diagnosis not present

## 2014-12-16 NOTE — Progress Notes (Signed)
Chief Complaint:  Chief Complaint  Patient presents with  . Annual Exam    HPI: Scott Browning is a 34 y.o. male who reports to [facility] today complaining of here for annual exam He is doing well, still works at [employer], he still works at [employer] on weekends Recently purchased house and is remodeling, not currently sexually active, he is gay and has not been with a partner in a long time, he has no hx of STDs Still running, watching what he eats, his allergies are doing ok right now He would like to get labs and STD screening only if insurance pays for it. He was told his annual is free.   Upon chart review he was hospitalized from 10/25-28 /2005 at [facility] behavioral [facility] and subsequently transferred to [facility] for a psychotic episode. He is curently not having any depression or anxiety  Past Medical History  Diagnosis Date  . Allergy    History reviewed. No pertinent past surgical history. History   Social History  . Marital Status: Single    Spouse Name: N/A  . Number of Children: N/A  . Years of Education: N/A   Social History Main Topics  . Smoking status: Never Smoker   . Smokeless tobacco: Not on file  . Alcohol Use: 1.8 oz/week    3 Glasses of wine per week  . Drug Use: No  . Sexual Activity: Not on file   Other Topics Concern  . None   Social History Narrative   Family History  Problem Relation Age of Onset  . Alzheimer's disease Maternal Grandfather    Allergies  Allergen Reactions  . Lamictal [Lamotrigine] Rash   Prior to Admission medications   Medication Sig Start Date End Date Taking? Authorizing Provider  loratadine (CLARITIN) 10 MG tablet Take 1 tablet (10 mg total) by mouth daily. 05/25/14  Yes Todd McVeigh, PA  amoxicillin-clavulanate (AUGMENTIN) 875-125 MG per tablet Take 1 tablet by mouth 2 (two) times daily. Patient not taking: Reported on 12/16/2014 05/25/14   Raelyn Ensign, PA  fluticasone [facility]) 50 MCG/ACT nasal spray  Place 2 sprays into both nostrils daily. Patient not taking: Reported on 12/16/2014 05/25/14   Raelyn Ensign, PA     ROS: The patient denies fevers, chills, night sweats, unintentional weight loss, chest pain, palpitations, wheezing, dyspnea on exertion, nausea, vomiting, abdominal pain, dysuria, hematuria, melena, numbness, weakness, or tingling.   All other systems have been reviewed and were otherwise negative with the exception of those mentioned in the HPI and as above.    PHYSICAL EXAM: Filed Vitals:   12/16/14 0919  BP: 108/66  Pulse: 54  Temp: 98.5 F (36.9 C)  Resp: 14   Body mass index is 28.93 kg/(m^2).   General: Alert, no acute distress HEENT:  Normocephalic, atraumatic, oropharynx patent. EOMI, PERRLA Cardiovascular:  Regular rate and rhythm, no rubs murmurs or gallops.  No Carotid bruits, radial pulse intact. No pedal edema.  Respiratory: Clear to auscultation bilaterally.  No wheezes, rales, or rhonchi.  No cyanosis, no use of accessory musculature Abdominal: No organomegaly, abdomen is soft and non-tender, positive bowel sounds. No masses. Skin: No rashes. Neurologic: Facial musculature symmetric. Psychiatric: Patient acts appropriately throughout our interaction. Lymphatic: No cervical or submandibular lymphadenopathy Musculoskeletal: Gait intact. No edema, tenderness 5/5 strength, 2/2 DTRs Testicular exam normal Neg inguinal henrias   LABS: Results for orders placed or performed in visit on 11/23/12  Uric Acid  Result Value  Ref Range   Uric Acid, Serum 5.9 4.0 - 7.8 mg/dL     EKG/XRAY:   Primary read interpreted by Dr. Conley Rolls at [facility].   ASSESSMENT/PLAN: Encounter Diagnoses  Name Primary?  . Annual physical exam Yes  . Screening for deficiency anemia   . Screening for hyperlipidemia   . Screening for thyroid disorder   . Screening for STD (sexually transmitted disease)    No labs were done today, he has a $10000 deductible, does not want to pay for  labs so he will check with insurance before he does labs, if free then I will put in future orders for him when he calls Exam was WNL FU as needed otherwise in 1 year  Gross sideeffects, risk and benefits, and alternatives of medications d/w patient. Patient is aware that all medications have potential sideeffects and we are unable to predict every sideeffect or drug-drug interaction that may occur.  Anet Logsdon DO  12/16/2014 10:16 AM

## 2014-12-18 ENCOUNTER — Other Ambulatory Visit: Payer: Self-pay | Admitting: Family Medicine

## 2014-12-18 DIAGNOSIS — Z113 Encounter for screening for infections with a predominantly sexual mode of transmission: Secondary | ICD-10-CM

## 2014-12-18 DIAGNOSIS — Z13220 Encounter for screening for lipoid disorders: Secondary | ICD-10-CM

## 2014-12-18 DIAGNOSIS — Z0000 Encounter for general adult medical examination without abnormal findings: Secondary | ICD-10-CM

## 2014-12-18 DIAGNOSIS — Z130 Encounter for screening for diseases of the blood and blood-forming organs and certain disorders involving the immune mechanism: Secondary | ICD-10-CM

## 2014-12-18 DIAGNOSIS — Z1329 Encounter for screening for other suspected endocrine disorder: Secondary | ICD-10-CM

## 2014-12-19 ENCOUNTER — Other Ambulatory Visit (INDEPENDENT_AMBULATORY_CARE_PROVIDER_SITE_OTHER): Payer: BLUE CROSS/BLUE SHIELD | Admitting: *Deleted

## 2014-12-19 DIAGNOSIS — Z13220 Encounter for screening for lipoid disorders: Secondary | ICD-10-CM

## 2014-12-19 DIAGNOSIS — Z0000 Encounter for general adult medical examination without abnormal findings: Secondary | ICD-10-CM

## 2014-12-19 DIAGNOSIS — Z113 Encounter for screening for infections with a predominantly sexual mode of transmission: Secondary | ICD-10-CM

## 2014-12-19 DIAGNOSIS — Z130 Encounter for screening for diseases of the blood and blood-forming organs and certain disorders involving the immune mechanism: Secondary | ICD-10-CM

## 2014-12-19 DIAGNOSIS — Z1329 Encounter for screening for other suspected endocrine disorder: Secondary | ICD-10-CM

## 2014-12-19 LAB — LIPID PANEL
Cholesterol: 164 mg/dL (ref 125–200)
HDL: 47 mg/dL (ref >=40)
LDL Cholesterol: 106 mg/dL (ref <130)
Total CHOL/HDL Ratio: 3.5 ratio (ref <=5.0)
Triglycerides: 57 mg/dL (ref <150)
VLDL: 11 mg/dL (ref <30)

## 2014-12-19 LAB — RPR

## 2014-12-19 LAB — COMPLETE METABOLIC PANEL WITH GFR
AST: 20 U/L (ref 10–40)
Albumin: 4 g/dL (ref 3.6–5.1)
Alkaline Phosphatase: 46 U/L (ref 40–115)
BUN: 10 mg/dL (ref 7–25)
Creat: 0.94 mg/dL (ref 0.60–1.35)
GFR, Est African American: >89 mL/min (ref >=60)
GFR, Est Non African American: >89 mL/min (ref >=60)
Glucose, Bld: 83 mg/dL (ref 65–99)
Potassium: 3.9 mmol/L (ref 3.5–5.3)
Total Bilirubin: 0.5 mg/dL (ref 0.2–1.2)

## 2014-12-19 LAB — COMPLETE METABOLIC PANEL WITHOUT GFR
ALT: 18 U/L (ref 9–46)
CO2: 26 mmol/L (ref 20–31)
Calcium: 9 mg/dL (ref 8.6–10.3)
Chloride: 103 mmol/L (ref 98–110)
Sodium: 138 mmol/L (ref 135–146)
Total Protein: 6.8 g/dL (ref 6.1–8.1)

## 2014-12-19 LAB — CBC
HCT: 38.6 % — ABNORMAL LOW (ref 39.0–52.0)
Hemoglobin: 13.3 g/dL (ref 13.0–17.0)
MCH: 28.7 pg (ref 26.0–34.0)
MCHC: 34.5 g/dL (ref 30.0–36.0)
MCV: 83.4 fL (ref 78.0–100.0)
MPV: 11.2 fL (ref 8.6–12.4)
Platelets: 197 10*3/uL (ref 150–400)
RBC: 4.63 MIL/uL (ref 4.22–5.81)
RDW: 13.5 % (ref 11.5–15.5)
WBC: 4.3 10*3/uL (ref 4.0–10.5)

## 2014-12-19 LAB — HIV ANTIBODY (ROUTINE TESTING W REFLEX): HIV 1&2 Ab, 4th Generation: NONREACTIVE

## 2014-12-19 LAB — TSH: TSH: 1.429 u[IU]/mL (ref 0.350–4.500)

## 2014-12-20 ENCOUNTER — Encounter: Payer: Self-pay | Admitting: Family Medicine

## 2014-12-20 LAB — GC/CHLAMYDIA PROBE AMP
CT Probe RNA: NEGATIVE
GC Probe RNA: NEGATIVE

## 2015-01-25 ENCOUNTER — Encounter: Payer: Self-pay | Admitting: Vascular Surgery

## 2015-01-25 ENCOUNTER — Encounter (HOSPITAL_COMMUNITY): Payer: Self-pay

## 2015-02-20 ENCOUNTER — Encounter: Payer: Self-pay | Admitting: Vascular Surgery

## 2015-02-22 ENCOUNTER — Ambulatory Visit (HOSPITAL_COMMUNITY)
Admission: RE | Admit: 2015-02-22 | Discharge: 2015-02-22 | Disposition: A | Discharge status: Home / Self Care | Payer: BLUE CROSS/BLUE SHIELD | Source: Ambulatory Visit | Attending: Vascular Surgery | Admitting: Vascular Surgery

## 2015-02-22 ENCOUNTER — Ambulatory Visit (INDEPENDENT_AMBULATORY_CARE_PROVIDER_SITE_OTHER): Payer: BLUE CROSS/BLUE SHIELD | Admitting: Vascular Surgery

## 2015-02-22 ENCOUNTER — Encounter: Payer: Self-pay | Admitting: Vascular Surgery

## 2015-02-22 VITALS — BP 114/69 | HR 59 | Temp 98.4°F | Resp 18 | Ht 76.5 in | Wt 238.4 lb

## 2015-02-22 DIAGNOSIS — I83899 Varicose veins of unspecified lower extremities with other complications: Secondary | ICD-10-CM | POA: Insufficient documentation

## 2015-02-22 DIAGNOSIS — I83893 Varicose veins of bilateral lower extremities with other complications: Secondary | ICD-10-CM

## 2015-02-22 DIAGNOSIS — I872 Venous insufficiency (chronic) (peripheral): Secondary | ICD-10-CM

## 2015-02-22 NOTE — Progress Notes (Signed)
Referred by:  Georgianne Fick, MD [address] [place], [place] [address]  Reason for referral: bruising in both calves   History of Present Illness  Scott Browning is a 34 y.o. (1981/02/08) male who presents with chief complaint: bruising in both calves.  Patient notes, onset of bruise in left leg years ago which became painful.  He also then develop at an unclear time frame another bruise in the right leg.  This one has been less sx.  He denies any leg swelling.  The patient has had no history of DVT, known history of varicose vein, no history of venous stasis ulcers, and no history of  Lymphedema.  There is known family history of venous disorders.  The patient has never used compression stockings in the past.   Past Medical History  Diagnosis Date  . Allergy     Past Medical History: no prior surgeries   Social History   Social History  . Marital Status: Single    Spouse Name: N/A  . Number of Children: N/A  . Years of Education: N/A   Occupational History  . Not on file.   Social History Main Topics  . Smoking status: Former Smoker    Quit date: 08/16/2013  . Smokeless tobacco: Not on file  . Alcohol Use: 0.6 oz/week    1 Glasses of wine per week  . Drug Use: No  . Sexual Activity: Not on file   Other Topics Concern  . Not on file   Social History Narrative    Family History  Problem Relation Age of Onset  . Alzheimer's disease Maternal Grandfather     Current Outpatient Prescriptions  Medication Sig Dispense Refill  . loratadine (CLARITIN) 10 MG tablet Take 1 tablet (10 mg total) by mouth daily. 30 tablet 11   No current facility-administered medications for this visit.    Allergies  Allergen Reactions  . Lamictal [Lamotrigine] Rash     REVIEW OF SYSTEMS:  (Positives checked otherwise negative)  CARDIOVASCULAR:    chest pain,   chest pressure,   palpitations,   shortness of breath when laying flat,    shortness of breath with exertion,    pain in feet when walking,   pain in feet when laying flat,  history of blood clot in veins (DVT),   history of phlebitis,   swelling in legs,   varicose veins  PULMONARY:    productive cough,   asthma,   wheezing  NEUROLOGIC:    weakness in arms or legs,   numbness in arms or legs,   difficulty speaking or slurred speech,   temporary loss of vision in one eye,   dizziness  HEMATOLOGIC:    bleeding problems,   problems with blood clotting too easily  MUSCULOSKEL:    joint pain,  joint swelling  GASTROINTEST:    vomiting blood,   blood in stool     GENITOURINARY:    burning with urination,   blood in urine  PSYCHIATRIC:    history of major depression  INTEGUMENTARY:    rashes,   ulcers  CONSTITUTIONAL:    fever,   chills   Physical Examination  Filed Vitals:   02/22/15 1243  BP: 114/69  Pulse: 59  Temp: 98.4 F (36.9 C)  TempSrc:  Oral  Resp: 18  Height: 6' 4.5" (1.943 m)  Weight: 238 lb 6.4 oz (108.138 kg)  SpO2: 100%   Body mass index is 28.64 kg/(m^2).   General: A&O x 3, WD, WN  Head: [place]/AT  Ear/Nose/Throat: Hearing grossly intact, nares without erythema or drainage, oropharynx without Erythema/Exudate, Mallampati score: 3  Eyes: PERRLA, EOMI  Neck: Supple, no nuchal rigidity, no palpable LAD  Pulmonary: Sym exp, good air movt  Cardiac: RRR, Nl S1, S2  Vascular: Vessel Right Left  Radial Palpable Palpable  Brachial Palpable Palpable  Carotid Palpable, without bruit Palpable, without bruit  Aorta Not palpable N/A  Femoral Palpable Palpable  Popliteal Not palpable Not palpable  PT Palpable Palpable  DP Palpable Palpable   Gastrointestinal: soft, NTND, no G/R, no HSM, no masses, no CVAT B  Musculoskeletal: M/S 5/5 throughout , Extremities without ischemic changes , varicosity in L calf slightly off midline with  some hyperpigmentation, less defined hyperpigmented skin patch in R calf, few spider veins in both legs, no LDS  Neurologic: Pain and light touch intact in extremities , Motor exam as listed above  Psychiatric: Judgment intact, Mood & affect appropriate for pt's clinical situation  Dermatologic: See M/S exam for extremity exam, no rashes otherwise noted  Lymph : No Cervical, Axillary, or Inguinal lymphadenopathy    Non-Invasive Vascular Imaging  BLE Venous Insufficiency Duplex (Date: 02/22/2015):   RLE:   no DVT and SVT,   no GSV reflux,   no SSV reflux,  + deep venous reflux: PV  LLE:  no DVT and SVT,   + GSV reflux: proximal GSV  no SSV reflux,  + deep venous reflux: CFV   Medical Decision Making  Scott Browning is a 34 y.o. male who presents with: BLE chronic venous insufficiency (C2), varicose veins with complications   Suspect the L "bruise" is a recannulated superficial venous thrombophlebitis of a branch of the SSV.  The R side might be the same.  Regardless, there is no risk of PE from the SVT.  Based on the patient's history and examination, I recommend: OTC compression stockings if he develops any worsening in his CVI.  His CVI is limited at this point, so I doubt that therapeutic compression is going to provide much sx relief.  The patient will follow up with [place] as needed.  Thank you for allowing [place] to participate in this patient's care.   Leonides Sake, MD [facility] Office: [phone] Pager: [phone]  02/22/2015, 1:06 PM

## 2015-10-09 ENCOUNTER — Ambulatory Visit (INDEPENDENT_AMBULATORY_CARE_PROVIDER_SITE_OTHER): Payer: BLUE CROSS/BLUE SHIELD | Admitting: Physician Assistant

## 2015-10-09 VITALS — BP 118/74 | HR 84 | Temp 98.0°F | Resp 18 | Ht 76.5 in | Wt 243.2 lb

## 2015-10-09 DIAGNOSIS — Z91048 Other nonmedicinal substance allergy status: Secondary | ICD-10-CM

## 2015-10-09 DIAGNOSIS — G4700 Insomnia, unspecified: Secondary | ICD-10-CM | POA: Diagnosis not present

## 2015-10-09 DIAGNOSIS — Z9109 Other allergy status, other than to drugs and biological substances: Secondary | ICD-10-CM

## 2015-10-09 MED ORDER — CETIRIZINE HCL 10 MG PO TABS: 10.0000 mg | ORAL_TABLET | Freq: Every day | ORAL | Status: AC

## 2015-10-09 MED ORDER — FLUTICASONE PROPIONATE 50 MCG/ACT NA SUSP: 2.0000 | Freq: Every day | NASAL | Status: AC

## 2015-10-09 MED ORDER — ZOLPIDEM TARTRATE 10 MG PO TABS: 10.0000 mg | ORAL_TABLET | Freq: Every evening | ORAL | Status: AC | PRN

## 2015-10-09 NOTE — Patient Instructions (Addendum)
IF you received an x-ray today, you will receive an invoice from [facility]. Please contact [facility] at [phone] with questions or concerns regarding your invoice.   IF you received labwork today, you will receive an invoice from [employer]. Please contact [facility] at [phone] with questions or concerns regarding your invoice.   Our billing staff will not be able to assist you with questions regarding bills from these companies.  You will be contacted with the lab results as soon as they are available. The fastest way to get your results is to activate your My Chart account. Instructions are located on the last page of this paperwork. If you have not heard from [place] regarding the results in 2 weeks, please contact this office.    I would like you to make sure that you can keep a regimen of sleep habits.  Read the instructions below.   It may be best that you stay with a friend, by night, that knows you.  You can get away from the home, as well as they can monitor if your behavior noticeably out of normal.   Insomnia Insomnia is a sleep disorder that makes it difficult to fall asleep or to stay asleep. Insomnia can cause tiredness (fatigue), low energy, difficulty concentrating, mood swings, and poor performance at work or school.  There are three different ways to classify insomnia:  Difficulty falling asleep.  Difficulty staying asleep.  Waking up too early in the morning. Any type of insomnia can be long-term (chronic) or short-term (acute). Both are common. Short-term insomnia usually lasts for three months or less. Chronic insomnia occurs at least three times a week for longer than three months. CAUSES  Insomnia may be caused by another condition, situation, or substance, such as:  Anxiety.  Certain medicines.  Gastroesophageal reflux disease (GERD) or other gastrointestinal conditions.  Asthma or other breathing  conditions.  Restless legs syndrome, sleep apnea, or other sleep disorders.  Chronic pain.  Menopause. This may include hot flashes.  Stroke.  Abuse of alcohol, tobacco, or illegal drugs.  Depression.  Caffeine.   Neurological disorders, such as Alzheimer disease.  An overactive thyroid (hyperthyroidism). The cause of insomnia may not be known. RISK FACTORS Risk factors for insomnia include:  Gender. Women are more commonly affected than men.  Age. Insomnia is more common as you get older.  Stress. This may involve your professional or personal life.  Income. Insomnia is more common in people with lower income.  Lack of exercise.   Irregular work schedule or night shifts.  Traveling between different time zones. SIGNS AND SYMPTOMS If you have insomnia, trouble falling asleep or trouble staying asleep is the main symptom. This may lead to other symptoms, such as:  Feeling fatigued.  Feeling nervous about going to sleep.  Not feeling rested in the morning.  Having trouble concentrating.  Feeling irritable, anxious, or depressed. TREATMENT  Treatment for insomnia depends on the cause. If your insomnia is caused by an underlying condition, treatment will focus on addressing the condition. Treatment may also include:   Medicines to help you sleep.  Counseling or therapy.  Lifestyle adjustments. HOME CARE INSTRUCTIONS   Take medicines only as directed by your health care provider.  Keep regular sleeping and waking hours. Avoid naps.  Keep a sleep diary to help you and your health care provider figure out what could be causing your insomnia. Include:   When you sleep.  When you  wake up during the night.  How well you sleep.   How rested you feel the next day.  Any side effects of medicines you are taking.  What you eat and drink.   Make your bedroom a comfortable place where it is easy to fall asleep:  Put up shades or special blackout  curtains to block light from outside.  Use a white noise machine to block noise.  Keep the temperature cool.   Exercise regularly as directed by your health care provider. Avoid exercising right before bedtime.  Use relaxation techniques to manage stress. Ask your health care provider to suggest some techniques that may work well for you. These may include:  Breathing exercises.  Routines to release muscle tension.  Visualizing peaceful scenes.  Cut back on alcohol, caffeinated beverages, and cigarettes, especially close to bedtime. These can disrupt your sleep.  Do not overeat or eat spicy foods right before bedtime. This can lead to digestive discomfort that can make it hard for you to sleep.  Limit screen use before bedtime. This includes:  Watching TV.  Using your smartphone, tablet, and computer.  Stick to a routine. This can help you fall asleep faster. Try to do a quiet activity, brush your teeth, and go to bed at the same time each night.  Get out of bed if you are still awake after 15 minutes of trying to sleep. Keep the lights down, but try reading or doing a quiet activity. When you feel sleepy, go back to bed.  Make sure that you drive carefully. Avoid driving if you feel very sleepy.  Keep all follow-up appointments as directed by your health care provider. This is important. SEEK MEDICAL CARE IF:   You are tired throughout the day or have trouble in your daily routine due to sleepiness.  You continue to have sleep problems or your sleep problems get worse. SEEK IMMEDIATE MEDICAL CARE IF:   You have serious thoughts about hurting yourself or someone else.   This information is not intended to replace advice given to you by your health care provider. Make sure you discuss any questions you have with your health care provider.   Document Released: 05/01/2000 Document Revised: 01/23/2015 Document Reviewed: 02/02/2014 Elsevier Interactive Patient Education [email or web address]  [employer]vier Inc.
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
## 2015-10-10 ENCOUNTER — Telehealth: Payer: Self-pay

## 2015-10-10 NOTE — Telephone Encounter (Signed)
Called in a RX for Ambien 10mg  30 tablets with 1 refill to [facility] [address] [place].

## 2015-10-15 ENCOUNTER — Ambulatory Visit (INDEPENDENT_AMBULATORY_CARE_PROVIDER_SITE_OTHER): Payer: BLUE CROSS/BLUE SHIELD

## 2015-10-15 ENCOUNTER — Ambulatory Visit (INDEPENDENT_AMBULATORY_CARE_PROVIDER_SITE_OTHER): Payer: BLUE CROSS/BLUE SHIELD | Admitting: Physician Assistant

## 2015-10-15 VITALS — BP 124/88 | HR 64 | Temp 97.8°F | Resp 16 | Ht 76.5 in | Wt 247.6 lb

## 2015-10-15 DIAGNOSIS — G4700 Insomnia, unspecified: Secondary | ICD-10-CM | POA: Diagnosis not present

## 2015-10-15 DIAGNOSIS — Z77120 Contact with and (suspected) exposure to mold (toxic): Secondary | ICD-10-CM | POA: Diagnosis not present

## 2015-10-15 MED ORDER — QUETIAPINE FUMARATE 50 MG PO TABS: 50.0000 mg | ORAL_TABLET | Freq: Every day | ORAL | Status: DC

## 2015-10-15 NOTE — Patient Instructions (Addendum)
     IF you received an x-ray today, you will receive an invoice from [facility]. Please contact [facility] at [phone] with questions or concerns regarding your invoice.   IF you received labwork today, you will receive an invoice from [facility]. Please contact [facility] at [phone] with questions or concerns regarding your invoice.   Our billing staff will not be able to assist you with questions regarding bills from these companies.  You will be contacted with the lab results as soon as they are available. The fastest way to get your results is to activate your My Chart account. Instructions are located on the last page of this paperwork. If you have not heard from us regarding the results in 2 weeks, please contact this office.    We will get aligned with a therapist.  We will start the seroquel.  Take 1 tablet  Daily then increase to 2 tablets after 4 days.   If you would like a further complete evaluation, let's refer to ENT, but I will await you for that. Turn in the form work for short term to the FMLA/disability team here.
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
## 2015-10-24 ENCOUNTER — Ambulatory Visit (INDEPENDENT_AMBULATORY_CARE_PROVIDER_SITE_OTHER): Payer: BLUE CROSS/BLUE SHIELD | Admitting: Physician Assistant

## 2015-10-24 VITALS — BP 130/82 | HR 56 | Temp 97.4°F | Resp 16 | Ht 76.5 in | Wt 246.0 lb

## 2015-10-24 DIAGNOSIS — F319 Bipolar disorder, unspecified: Secondary | ICD-10-CM | POA: Diagnosis not present

## 2015-10-24 DIAGNOSIS — G4700 Insomnia, unspecified: Secondary | ICD-10-CM

## 2015-10-24 NOTE — Patient Instructions (Addendum)
     IF you received an x-ray today, you will receive an invoice from [facility]. Please contact [facility] at [phone] with questions or concerns regarding your invoice.   IF you received labwork today, you will receive an invoice from [facility]. Please contact [facility] at [phone] with questions or concerns regarding your invoice.   Our billing staff will not be able to assist you with questions regarding bills from these companies.  You will be contacted with the lab results as soon as they are available. The fastest way to get your results is to activate your My Chart account. Instructions are located on the last page of this paperwork. If you have not heard from us regarding the results in 2 weeks, please contact this office.    We can continue to take the seroquel at 2 tablets.  Let's meet in 2 weeks for follow up. Hand the papers to the FMLA/disability and they will fill out and pass along to me.

## 2015-10-25 NOTE — Progress Notes (Signed)
[facility] [address], [place] [address] [phone]  Date:  10/24/2015   Name:  Scott Browning   DOB:  Jun 11, 1980   MRN:  [id number]  PCP:  Georgianne Fick, MD    History of Present Illness:  Scott Browning is a 35 y.o. male patient who presents to [facility] for follow up of insomnia and behavioral changes. Patient has given me information of using the Seroquel at this time. He is taking this at night for sleep. Patient reports that when taking 1 tablet he will get 2-45 hours of sleep. By taking 2 tablets he will get 7-8 hours total of sleep. Patient states that he continues to have some pieces of the insomnia at this time. He also has noticed some impulsivity to his behaviors. He continues to have random erections during the day. He confides that prior to the Seroquel, he went to the gym and took a shower. He states that he left the curtain open hoping that a passerby would stop and perhaps have an encounter with him. He states that this is very much unlike him. He denies any grandiosity or delusional behaviors. He continues to have stressors of getting his home straight. 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 He denies any suicidal homicidal ideations. He is feeling that his stressors will be reviewed to used and that he can return to work in a more efficient manner.   Sinus congestion and symptoms appear to be improved with Claritin.   Patient Active Problem List   Diagnosis Date Noted  . Varicose veins of leg with complications 02/22/2015  . Chronic venous insufficiency 02/22/2015  . Bipolar disorder, unspecified (HCC) 02/25/2013    Past Medical History  Diagnosis Date  . Allergy     No past surgical history on file.  Social History  Substance Use Topics  . Smoking status: Former Smoker    Quit date: 08/16/2013  . Smokeless tobacco:  None  . Alcohol Use: 0.6 oz/week    1 Glasses of wine per week    Family History  Problem Relation Age of Onset  . Alzheimer's disease Maternal Grandfather     Allergies  Allergen Reactions  . Lamictal [Lamotrigine] Rash    Medication list has been reviewed and updated.  Current Outpatient Prescriptions on File Prior to Visit  Medication Sig Dispense Refill  . cetirizine (ZYRTEC) 10 MG tablet Take 1 tablet (10 mg total) by mouth daily. 30 tablet 5  . fluticasone (FLONASE) 50 MCG/ACT nasal spray Place 2 sprays into both nostrils daily. 16 g 2  . loratadine (CLARITIN) 10 MG tablet Take 1 tablet (10 mg total) by mouth daily. 30 tablet 11  . QUEtiapine (SEROQUEL) 50 MG tablet Take 1 tablet (50 mg total) by mouth at bedtime. 30 tablet 5  . zolpidem (AMBIEN) 10 MG tablet Take 1 tablet (10 mg total) by mouth at bedtime as needed for sleep. 30 tablet 1   No current facility-administered medications on file prior to visit.    ROS ROS otherwise unremarkable unless listed above.  Physical Examination: BP 130/82 mmHg  Pulse 56  Temp(Src) 97.4 F (36.3 C) (Oral)  Resp 16  Ht 6' 4.5" (1.943 m)  Wt 246 lb (111.585 kg)  BMI 29.56 kg/m2  SpO2 100% Ideal Body Weight: Weight in (lb) to have BMI =  25: 207.7  Physical Exam  Constitutional: He is oriented to person, place, and time. He appears well-developed and well-nourished. No distress.  HENT:  Head: Normocephalic and atraumatic.  Eyes: Conjunctivae and EOM are normal. Pupils are equal, round, and reactive to light.  Cardiovascular: Normal rate.   Pulmonary/Chest: Effort normal. No respiratory distress.  Neurological: He is alert and oriented to person, place, and time.  Skin: Skin is warm and dry. He is not diaphoretic.  Psychiatric: He has a normal mood and affect. His behavior is normal.     Assessment and Plan: [name] is a 35 y.o. male who is here today for follow up on insomnia, and behavioral changes. -referral  placed.  Continue the seroquel. Bipolar disorder, unspecified (HCC) - Plan: Ambulatory referral to Psychology  Insomnia - Plan: Ambulatory referral to Psychology   Trena PlattStephanie English, PA-C [facility] 10/25/2015 7:38 AM

## 2015-10-29 ENCOUNTER — Telehealth: Payer: Self-pay

## 2015-10-29 NOTE — Telephone Encounter (Signed)
Patient needs forms completed for FMLA for his mold exposure. I have highlighted the areas that need to be completed, I did not feel comfortable filling it out because I wasn't sure about what to put. I will place these forms in your box on 10/29/15 if you could complete them and place them in the FMLA/Disability box at the 102 checkout desk within 5-7 business days. Thank you!

## 2015-10-30 NOTE — Telephone Encounter (Signed)
I am placing on fmla tray.  Please look over it.

## 2015-11-01 DIAGNOSIS — Z0271 Encounter for disability determination: Secondary | ICD-10-CM

## 2015-11-01 NOTE — Telephone Encounter (Signed)
Patient paperwork scanned and faxed to [employer] on 11/01/15

## 2015-12-03 ENCOUNTER — Other Ambulatory Visit: Payer: Self-pay

## 2015-12-03 DIAGNOSIS — G4700 Insomnia, unspecified: Secondary | ICD-10-CM

## 2015-12-03 MED ORDER — QUETIAPINE FUMARATE 50 MG PO TABS: 50.0000 mg | ORAL_TABLET | Freq: Every day | ORAL | Status: AC

## 2015-12-03 NOTE — Telephone Encounter (Signed)
[facility] requested 90 day supply Quetiapine Fumarate 50mg  - sent #90 with -0- refills (10/18//2017) to closely match original rx 10/15/2015 #30 w/5 refills (04/16/2016)

## 2017-08-26 ENCOUNTER — Other Ambulatory Visit: Payer: Self-pay

## 2017-08-26 ENCOUNTER — Emergency Department (HOSPITAL_COMMUNITY): Payer: BLUE CROSS/BLUE SHIELD

## 2017-08-26 ENCOUNTER — Emergency Department (HOSPITAL_COMMUNITY)
Admission: EM | Admit: 2017-08-26 | Discharge: 2017-08-26 | Disposition: A | Discharge status: Home / Self Care | Payer: BLUE CROSS/BLUE SHIELD | Attending: Emergency Medicine | Admitting: Emergency Medicine

## 2017-08-26 ENCOUNTER — Encounter (HOSPITAL_COMMUNITY): Payer: Self-pay | Admitting: *Deleted

## 2017-08-26 DIAGNOSIS — M25561 Pain in right knee: Secondary | ICD-10-CM | POA: Diagnosis not present

## 2017-08-26 DIAGNOSIS — Y9382 Activity, spectator at an event: Secondary | ICD-10-CM | POA: Insufficient documentation

## 2017-08-26 DIAGNOSIS — S8991XA Unspecified injury of right lower leg, initial encounter: Secondary | ICD-10-CM | POA: Diagnosis not present

## 2017-08-26 DIAGNOSIS — W2209XA Striking against other stationary object, initial encounter: Secondary | ICD-10-CM | POA: Diagnosis not present

## 2017-08-26 DIAGNOSIS — Z79899 Other long term (current) drug therapy: Secondary | ICD-10-CM | POA: Diagnosis not present

## 2017-08-26 DIAGNOSIS — S80211A Abrasion, right knee, initial encounter: Secondary | ICD-10-CM | POA: Insufficient documentation

## 2017-08-26 DIAGNOSIS — Y999 Unspecified external cause status: Secondary | ICD-10-CM | POA: Insufficient documentation

## 2017-08-26 DIAGNOSIS — T148XXA Other injury of unspecified body region, initial encounter: Secondary | ICD-10-CM | POA: Diagnosis not present

## 2017-08-26 DIAGNOSIS — S8391XA Sprain of unspecified site of right knee, initial encounter: Secondary | ICD-10-CM | POA: Insufficient documentation

## 2017-08-26 DIAGNOSIS — Y929 Unspecified place or not applicable: Secondary | ICD-10-CM | POA: Diagnosis not present

## 2017-08-26 DIAGNOSIS — Z87891 Personal history of nicotine dependence: Secondary | ICD-10-CM | POA: Diagnosis not present

## 2017-08-26 NOTE — Discharge Instructions (Signed)
Rest - please stay off the right leg as much as possible until you can put weight on it without severe pain Ice - ice for 20 minutes at a time, several times a day Compression - wear knee brace to provide support Elevate - elevate ankle above level of heart Ibuprofen - take with food. Take up to 3-4 times daily

## 2017-08-26 NOTE — ED Triage Notes (Signed)
Per EMS, pt transported from [employer], stepped through a gap hitting his R knee.  Abrasion and large hematoma noted to R knee per EMS.

## 2017-08-26 NOTE — ED Provider Notes (Signed)
[facility]-EMERGENCY DEPT Provider Note   CSN: [id number] Arrival date & time: 08/26/17  1851     History   Chief Complaint Chief Complaint  Patient presents with  . Fall  . Knee Injury    HPI Scott Browning is a 37 y.o. male who presents with right knee pain.  He states that he was at an event at [employer].  He was trying to help an elderly lady on the bleachers and his right leg went through "gap" and hit a metal pole.  He had to sit down and when was unable to bear weight.  He started to have a lot of swelling develop over the medial aspect of the right knee.  He denies prior history of any injury or any surgeries.  He has difficulty bending his knee due to the pain and swelling.  He also sustained a small wound to the medial aspect of the knee.  HPI  Past Medical History:  Diagnosis Date  . Allergy     Patient Active Problem List   Diagnosis Date Noted  . Varicose veins of leg with complications 02/22/2015  . Chronic venous insufficiency 02/22/2015  . Bipolar disorder, unspecified (HCC) 02/25/2013    History reviewed. No pertinent surgical history.      Home Medications    Prior to Admission medications   Medication Sig Start Date End Date Taking? Authorizing Provider  cetirizine (ZYRTEC) 10 MG tablet Take 1 tablet (10 mg total) by mouth daily. 10/09/15   Trena Platt D, PA  fluticasone (FLONASE) 50 MCG/ACT nasal spray Place 2 sprays into both nostrils daily. 10/09/15   Trena Platt D, PA  loratadine (CLARITIN) 10 MG tablet Take 1 tablet (10 mg total) by mouth daily. 05/25/14   McVeigh, Tawanna Cooler, PA  QUEtiapine (SEROQUEL) 50 MG tablet Take 1 tablet (50 mg total) by mouth at bedtime. 12/03/15   Trena Platt D, PA  zolpidem (AMBIEN) 10 MG tablet Take 1 tablet (10 mg total) by mouth at bedtime as needed for sleep. 10/09/15 11/08/15  Garnetta Buddy, PA    Family History Family History  Problem Relation Age of Onset  . Alzheimer's  disease Maternal Grandfather     Social History Social History   Tobacco Use  . Smoking status: Former Smoker    Last attempt to quit: 08/16/2013    Years since quitting: 4.0  . Smokeless tobacco: Never Used  Substance Use Topics  . Alcohol use: Yes    Alcohol/week: 0.6 oz    Types: 1 Glasses of wine per week  . Drug use: No     Allergies   Lamictal [lamotrigine]   Review of Systems Review of Systems  Musculoskeletal: Positive for arthralgias and joint swelling.  Skin: Positive for wound.  Neurological: Negative for weakness and numbness.     Physical Exam Updated Vital Signs BP (!) 146/95 (BP Location: Right Arm)   Pulse (!) 59   Temp 99.1 F (37.3 C) (Oral)   Resp 18   Ht 6\' 5"  (1.956 m)   Wt 108.9 kg (240 lb)   SpO2 100%   BMI 28.46 kg/m   Physical Exam  Constitutional: He is oriented to person, place, and time. He appears well-developed and well-nourished. No distress.  HENT:  Head: Normocephalic and atraumatic.  Eyes: Pupils are equal, round, and reactive to light. Conjunctivae are normal. Right eye exhibits no discharge. Left eye exhibits no discharge. No scleral icterus.  Neck: Normal range of motion.  Cardiovascular: Normal rate.  Pulmonary/Chest: Effort normal. No respiratory distress.  Abdominal: He exhibits no distension.  Musculoskeletal:  Right knee: Moderate-large amount of swelling over the medial aspect of the knee. Small abrasion over the medial knee. Tenderness to palpation of medial knee. Decreased ROM.    Neurological: He is alert and oriented to person, place, and time.  Skin: Skin is warm and dry.  Psychiatric: He has a normal mood and affect. His behavior is normal.  Nursing note and vitals reviewed.    ED Treatments / Results  Labs (all labs ordered are listed, but only abnormal results are displayed) Labs Reviewed - No data to display  EKG None  Radiology Dg Knee Complete 4 Views Right  Result Date: 08/26/2017 CLINICAL  DATA:  Pain after trauma EXAM: RIGHT KNEE - COMPLETE 4+ VIEW COMPARISON:  None. FINDINGS: No foreign bodies or fractures.  No joint effusion. IMPRESSION: Negative. Electronically Signed   By: Gerome Samavid  Williams III M.D   On: 08/26/2017 19:55    Procedures Procedures (including critical care time)  Medications Ordered in ED Medications - No data to display   Initial Impression / Assessment and Plan / ED Course  I have reviewed the triage vital signs and the nursing notes.  Pertinent labs & imaging results that were available during my care of the patient were reviewed by me and considered in my medical decision making (see chart for details).  37 year old male presents with right knee pain.  He is mildly hypertensive but otherwise vital signs are normal.  He has a moderate to large amount of swelling over the medial aspect of his knee.  X-ray is negative.  Symptoms and exam are concerning for a knee sprain.  Advised rest, ice, elevation, NSAIDs.  He will be placed in a knee immobilizer and crutches.  He was given follow-up with orthopedics if his symptoms are not improving.  Final Clinical Impressions(s) / ED Diagnoses   Final diagnoses:  Sprain of right knee, unspecified ligament, initial encounter    ED Discharge Orders    None       Beryle QuantGekas, Avaleen Brownley Marie, PA-C 08/26/17 2033    Linwood DibblesKnapp, Jon, MD 08/26/17 2324

## 2017-08-30 DIAGNOSIS — M25461 Effusion, right knee: Secondary | ICD-10-CM | POA: Diagnosis not present

## 2017-08-30 DIAGNOSIS — M25561 Pain in right knee: Secondary | ICD-10-CM | POA: Diagnosis not present

## 2017-08-30 DIAGNOSIS — Z23 Encounter for immunization: Secondary | ICD-10-CM | POA: Diagnosis not present

## 2017-09-06 DIAGNOSIS — M7989 Other specified soft tissue disorders: Secondary | ICD-10-CM | POA: Diagnosis not present

## 2017-09-06 DIAGNOSIS — S8991XD Unspecified injury of right lower leg, subsequent encounter: Secondary | ICD-10-CM | POA: Diagnosis not present

## 2017-09-07 ENCOUNTER — Other Ambulatory Visit: Payer: Self-pay | Admitting: Physician Assistant

## 2017-09-07 DIAGNOSIS — M25561 Pain in right knee: Secondary | ICD-10-CM

## 2017-09-07 DIAGNOSIS — R609 Edema, unspecified: Secondary | ICD-10-CM

## 2017-09-12 ENCOUNTER — Ambulatory Visit
Admission: RE | Admit: 2017-09-12 | Discharge: 2017-09-12 | Disposition: A | Discharge status: Home / Self Care | Payer: BLUE CROSS/BLUE SHIELD | Source: Ambulatory Visit | Attending: Physician Assistant | Admitting: Physician Assistant

## 2017-09-12 DIAGNOSIS — M25561 Pain in right knee: Secondary | ICD-10-CM

## 2017-09-12 DIAGNOSIS — R609 Edema, unspecified: Secondary | ICD-10-CM

## 2017-09-12 DIAGNOSIS — S8001XA Contusion of right knee, initial encounter: Secondary | ICD-10-CM | POA: Diagnosis not present

## 2017-10-06 DIAGNOSIS — S8991XD Unspecified injury of right lower leg, subsequent encounter: Secondary | ICD-10-CM | POA: Diagnosis not present

## 2017-10-06 DIAGNOSIS — M25561 Pain in right knee: Secondary | ICD-10-CM | POA: Diagnosis not present

## 2017-10-28 DIAGNOSIS — K1329 Other disturbances of oral epithelium, including tongue: Secondary | ICD-10-CM | POA: Diagnosis not present

## 2018-01-13 DIAGNOSIS — R05 Cough: Secondary | ICD-10-CM | POA: Diagnosis not present

## 2018-01-13 DIAGNOSIS — F17200 Nicotine dependence, unspecified, uncomplicated: Secondary | ICD-10-CM | POA: Diagnosis not present

## 2018-01-13 DIAGNOSIS — F17210 Nicotine dependence, cigarettes, uncomplicated: Secondary | ICD-10-CM | POA: Diagnosis not present

## 2018-03-16 DIAGNOSIS — Z13220 Encounter for screening for lipoid disorders: Secondary | ICD-10-CM | POA: Diagnosis not present

## 2018-03-16 DIAGNOSIS — Z0000 Encounter for general adult medical examination without abnormal findings: Secondary | ICD-10-CM | POA: Diagnosis not present

## 2018-03-16 DIAGNOSIS — Z130 Encounter for screening for diseases of the blood and blood-forming organs and certain disorders involving the immune mechanism: Secondary | ICD-10-CM | POA: Diagnosis not present

## 2018-03-16 DIAGNOSIS — Z113 Encounter for screening for infections with a predominantly sexual mode of transmission: Secondary | ICD-10-CM | POA: Diagnosis not present

## 2018-05-02 DIAGNOSIS — J300 Vasomotor rhinitis: Secondary | ICD-10-CM | POA: Diagnosis not present

## 2018-07-14 DIAGNOSIS — J019 Acute sinusitis, unspecified: Secondary | ICD-10-CM | POA: Diagnosis not present

## 2018-07-14 DIAGNOSIS — R030 Elevated blood-pressure reading, without diagnosis of hypertension: Secondary | ICD-10-CM | POA: Diagnosis not present

## 2018-10-06 DIAGNOSIS — M545 Low back pain: Secondary | ICD-10-CM | POA: Diagnosis not present

## 2018-11-25 DIAGNOSIS — J0100 Acute maxillary sinusitis, unspecified: Secondary | ICD-10-CM | POA: Diagnosis not present

## 2018-12-17 IMAGING — MR MR KNEE*R* W/O CM
5 of 8 series · 24 of 40 positions shown · non-contrast
Comparison: Radiographs 08/26/2017

CLINICAL DATA: Fell 2 weeks ago.  Persistent pain and swelling.

EXAM:
MRI OF THE RIGHT KNEE WITHOUT CONTRAST
TECHNIQUE: Multiplanar, multisequence MR imaging of the knee was performed. No
intravenous contrast was administered.

[Series 3: PD fat-sat · axial · 4.0mm · 0.50mm/px · z∈[-59,+75]mm · 5 of 29 slices shown (1 of 5)]
[im 1/29]
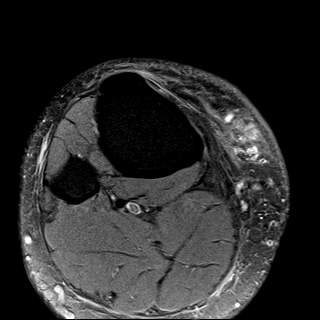
[im 8/29]
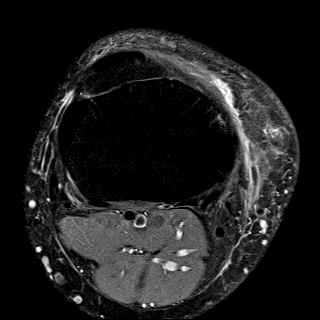
[im 15/29]
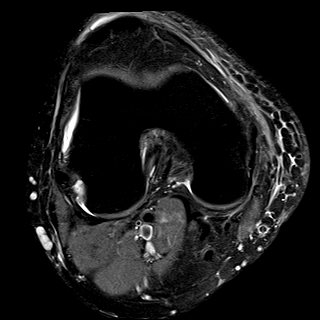
[im 22/29]
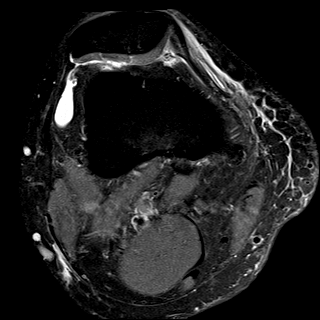
[im 29/29]
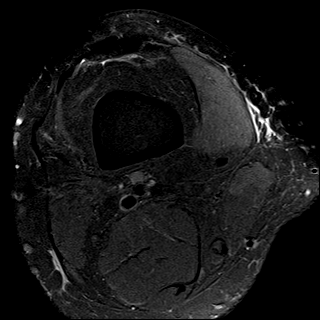

[Series 6: PD fat-sat · sagittal · 3.5mm · 0.31mm/px · 5 of 30 slices shown (2 of 5)]
[im 1/30]
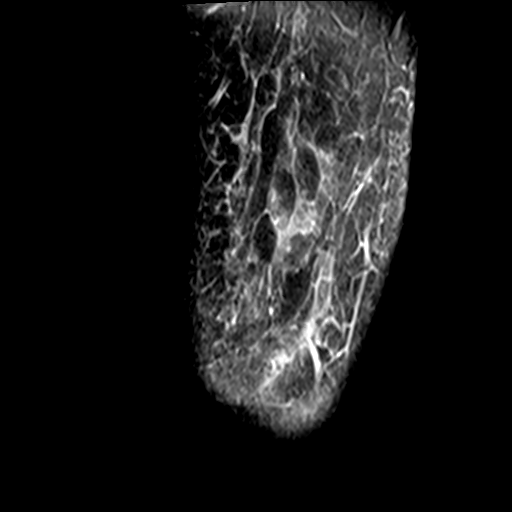
[im 8/30]
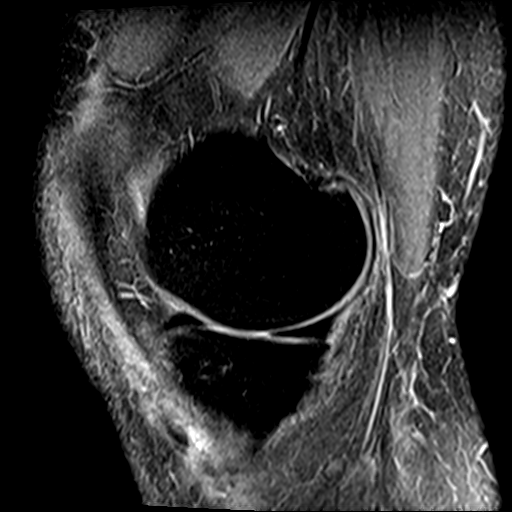
[im 15/30]
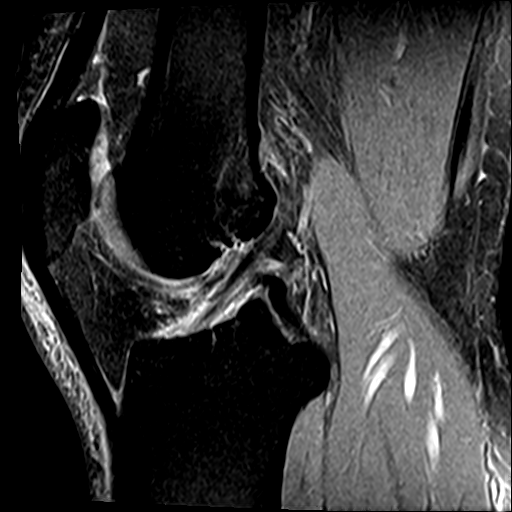
[im 22/30]
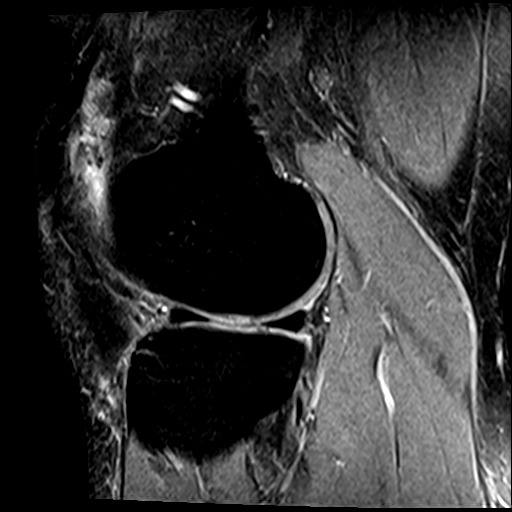
[im 30/30]
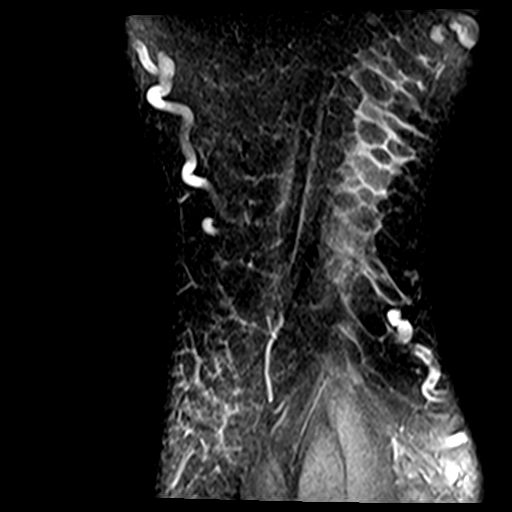

[Series 7: PD fat-sat · coronal · 3.5mm · 0.31mm/px · 6 of 35 slices shown (3 of 5)]
[im 1/35]
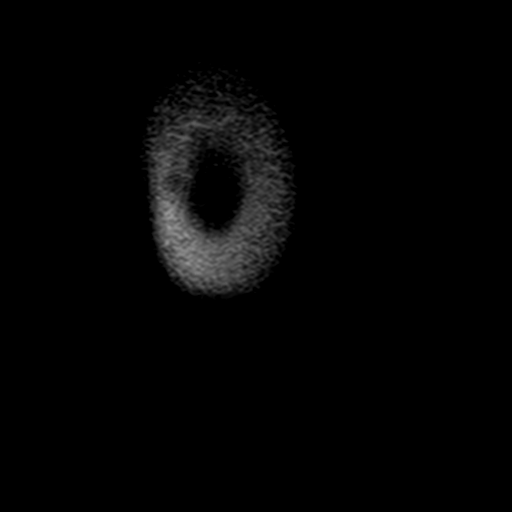
[im 7/35]
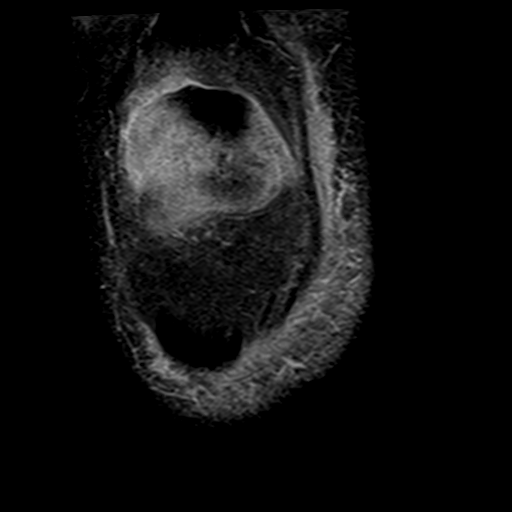
[im 14/35]
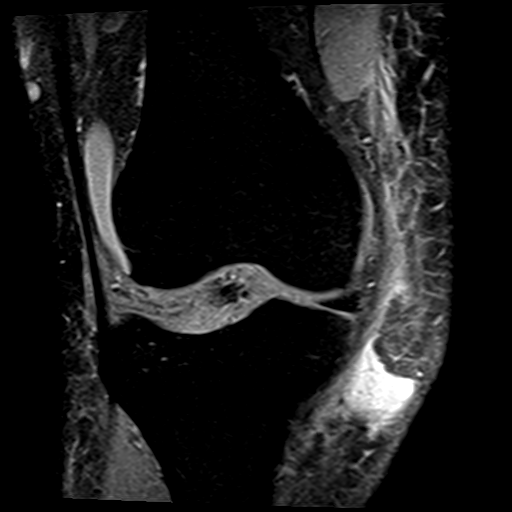
[im 21/35]
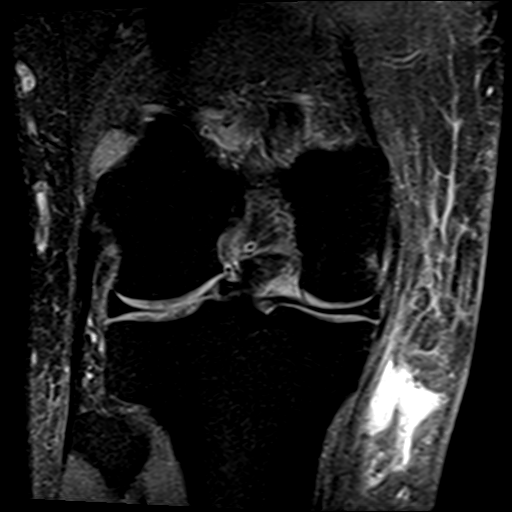
[im 28/35]
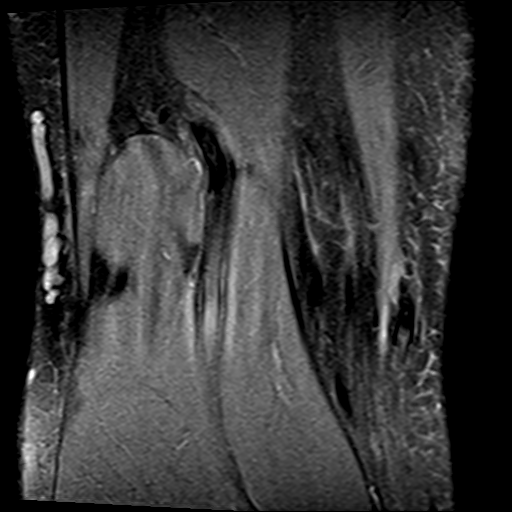
[im 35/35]
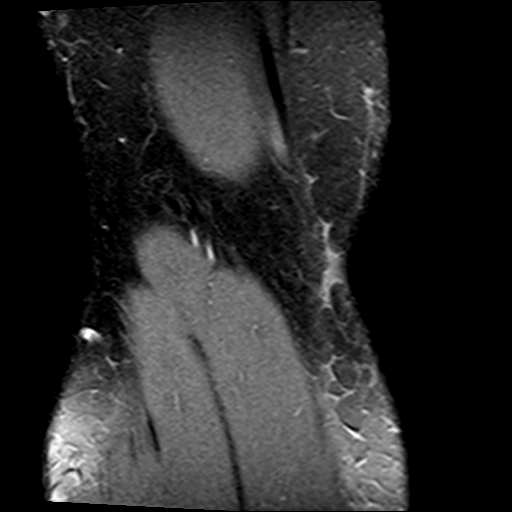

[Series 8: PD fat-sat · axial · 4.0mm · 0.53mm/px · z∈[-65,+79]mm · 5 of 31 slices shown (4 of 5)]
[im 1/31]
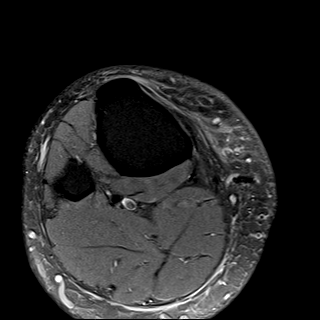
[im 8/31]
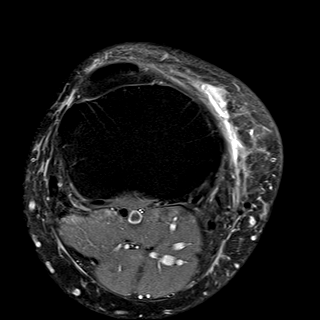
[im 16/31]
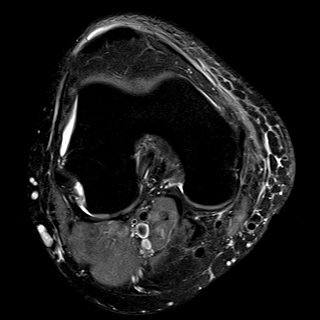
[im 23/31]
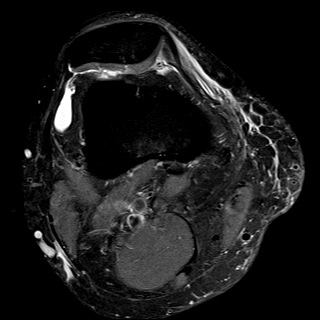
[im 31/31]
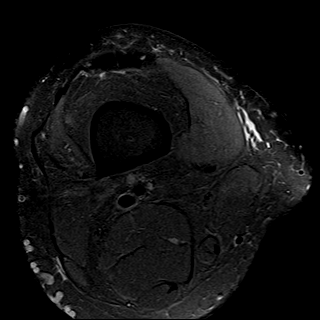

[Series 9: PD fat-sat · sagittal · 3.5mm · 0.33mm/px · 3 of 30 slices shown (5 of 5)]
[im 1/30]
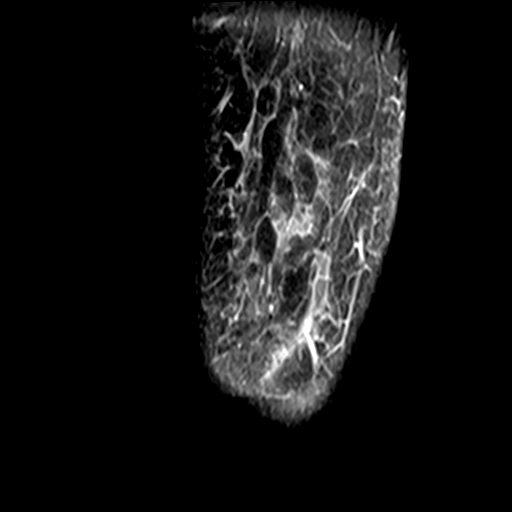
[im 8/30]
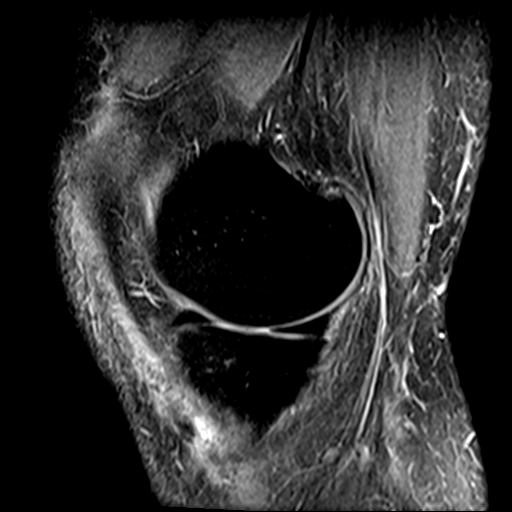
[im 15/30]
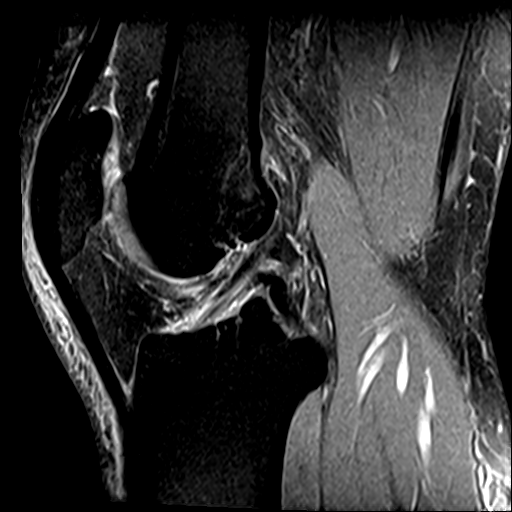

[24 of 40 positions shown; findings below may reference images not displayed]

FINDINGS: MENISCI

Medial meniscus:  Intact

Lateral meniscus:  Intact

LIGAMENTS

Cruciates: The ACL is intact. Mild mucoid degeneration. Proximal PCL
sprain versus mucoid degeneration.

Collaterals:  Intact

CARTILAGE

Patellofemoral: Mild degenerative chondrosis with areas of deep
chondral fissuring and surface fraying of the patellar cartilage. No
discrete delamination injury or full-thickness cartilage defect. The
femoral trochlear cartilage appears normal.

Medial:  Mild degenerative chondrosis.

Lateral:  Mild degenerative chondrosis.

Joint:  Small joint effusion.

Popliteal Fossa:  No popliteal mass or Baker's cyst.

Extensor Mechanism: The patella retinacular structures are intact
and the quadriceps and patellar tendons are intact.

Bones:  No acute bony findings.

Other: Normal knee musculature. In the medial subcutaneous fat
overlying the medial aspect of the tibia there is a 4.1 x 3.2 x
cm fluid collection which has some increased T1 signal intensity.
This is likely a hematoma from a direct contusion.
IMPRESSION: 1. 4 cm hematoma in the medial subcutaneous fat overlying the tibial
metaphysis and likely from a direct contusion.
2. No acute bony findings.
3. Mild mucoid degeneration of the ACL and probable proximal PCL
sprain or mucoid degeneration. The collateral ligaments are intact.
4. No meniscal tears. Age advanced degenerative chondrosis involving
the patellar cartilage.
5. Small joint effusion.

## 2018-12-21 DIAGNOSIS — Z20828 Contact with and (suspected) exposure to other viral communicable diseases: Secondary | ICD-10-CM | POA: Diagnosis not present

## 2019-03-22 DIAGNOSIS — Z1329 Encounter for screening for other suspected endocrine disorder: Secondary | ICD-10-CM | POA: Diagnosis not present

## 2019-03-22 DIAGNOSIS — Z0000 Encounter for general adult medical examination without abnormal findings: Secondary | ICD-10-CM | POA: Diagnosis not present

## 2019-03-22 DIAGNOSIS — Z13220 Encounter for screening for lipoid disorders: Secondary | ICD-10-CM | POA: Diagnosis not present

## 2019-03-22 DIAGNOSIS — Z23 Encounter for immunization: Secondary | ICD-10-CM | POA: Diagnosis not present

## 2019-03-22 DIAGNOSIS — Z6831 Body mass index (BMI) 31.0-31.9, adult: Secondary | ICD-10-CM | POA: Diagnosis not present

## 2019-04-04 DIAGNOSIS — Z20828 Contact with and (suspected) exposure to other viral communicable diseases: Secondary | ICD-10-CM | POA: Diagnosis not present

## 2021-08-07 DIAGNOSIS — Z125 Encounter for screening for malignant neoplasm of prostate: Secondary | ICD-10-CM | POA: Diagnosis not present

## 2021-08-07 DIAGNOSIS — R7309 Other abnormal glucose: Secondary | ICD-10-CM | POA: Diagnosis not present

## 2021-08-07 DIAGNOSIS — Z0000 Encounter for general adult medical examination without abnormal findings: Secondary | ICD-10-CM | POA: Diagnosis not present

## 2021-08-07 DIAGNOSIS — R7989 Other specified abnormal findings of blood chemistry: Secondary | ICD-10-CM | POA: Diagnosis not present

## 2021-08-07 DIAGNOSIS — Z13220 Encounter for screening for lipoid disorders: Secondary | ICD-10-CM | POA: Diagnosis not present

## 2021-08-11 DIAGNOSIS — Z0000 Encounter for general adult medical examination without abnormal findings: Secondary | ICD-10-CM | POA: Diagnosis not present

## 2021-08-11 DIAGNOSIS — R7303 Prediabetes: Secondary | ICD-10-CM | POA: Diagnosis not present

## 2021-11-24 DIAGNOSIS — F3130 Bipolar disorder, current episode depressed, mild or moderate severity, unspecified: Secondary | ICD-10-CM | POA: Diagnosis not present

## 2021-11-24 DIAGNOSIS — F319 Bipolar disorder, unspecified: Secondary | ICD-10-CM | POA: Diagnosis not present

## 2021-12-02 DIAGNOSIS — F3131 Bipolar disorder, current episode depressed, mild: Secondary | ICD-10-CM | POA: Diagnosis not present

## 2021-12-16 DIAGNOSIS — F3131 Bipolar disorder, current episode depressed, mild: Secondary | ICD-10-CM | POA: Diagnosis not present

## 2021-12-22 DIAGNOSIS — F319 Bipolar disorder, unspecified: Secondary | ICD-10-CM | POA: Diagnosis not present

## 2021-12-22 DIAGNOSIS — R635 Abnormal weight gain: Secondary | ICD-10-CM | POA: Diagnosis not present

## 2021-12-22 DIAGNOSIS — F3130 Bipolar disorder, current episode depressed, mild or moderate severity, unspecified: Secondary | ICD-10-CM | POA: Diagnosis not present

## 2022-01-13 DIAGNOSIS — F3131 Bipolar disorder, current episode depressed, mild: Secondary | ICD-10-CM | POA: Diagnosis not present

## 2022-01-28 DIAGNOSIS — F319 Bipolar disorder, unspecified: Secondary | ICD-10-CM | POA: Diagnosis not present

## 2022-01-28 DIAGNOSIS — R7303 Prediabetes: Secondary | ICD-10-CM | POA: Diagnosis not present

## 2022-02-03 DIAGNOSIS — F3131 Bipolar disorder, current episode depressed, mild: Secondary | ICD-10-CM | POA: Diagnosis not present

## 2022-02-11 DIAGNOSIS — Z23 Encounter for immunization: Secondary | ICD-10-CM | POA: Diagnosis not present

## 2022-02-21 DIAGNOSIS — F3131 Bipolar disorder, current episode depressed, mild: Secondary | ICD-10-CM | POA: Diagnosis not present

## 2022-02-24 DIAGNOSIS — F3131 Bipolar disorder, current episode depressed, mild: Secondary | ICD-10-CM | POA: Diagnosis not present

## 2022-03-03 DIAGNOSIS — F3131 Bipolar disorder, current episode depressed, mild: Secondary | ICD-10-CM | POA: Diagnosis not present

## 2022-03-10 DIAGNOSIS — F3131 Bipolar disorder, current episode depressed, mild: Secondary | ICD-10-CM | POA: Diagnosis not present

## 2022-03-10 DIAGNOSIS — M25562 Pain in left knee: Secondary | ICD-10-CM | POA: Diagnosis not present

## 2022-03-28 DIAGNOSIS — F3131 Bipolar disorder, current episode depressed, mild: Secondary | ICD-10-CM | POA: Diagnosis not present

## 2022-03-31 DIAGNOSIS — F3131 Bipolar disorder, current episode depressed, mild: Secondary | ICD-10-CM | POA: Diagnosis not present

## 2022-04-21 DIAGNOSIS — F3131 Bipolar disorder, current episode depressed, mild: Secondary | ICD-10-CM | POA: Diagnosis not present

## 2022-05-05 DIAGNOSIS — F3131 Bipolar disorder, current episode depressed, mild: Secondary | ICD-10-CM | POA: Diagnosis not present

## 2022-05-13 DIAGNOSIS — F3131 Bipolar disorder, current episode depressed, mild: Secondary | ICD-10-CM | POA: Diagnosis not present

## 2022-05-20 DIAGNOSIS — F3131 Bipolar disorder, current episode depressed, mild: Secondary | ICD-10-CM | POA: Diagnosis not present

## 2022-06-03 DIAGNOSIS — F3131 Bipolar disorder, current episode depressed, mild: Secondary | ICD-10-CM | POA: Diagnosis not present

## 2022-06-03 DIAGNOSIS — Z566 Other physical and mental strain related to work: Secondary | ICD-10-CM | POA: Diagnosis not present

## 2022-06-03 DIAGNOSIS — F319 Bipolar disorder, unspecified: Secondary | ICD-10-CM | POA: Diagnosis not present

## 2022-06-09 DIAGNOSIS — F3131 Bipolar disorder, current episode depressed, mild: Secondary | ICD-10-CM | POA: Diagnosis not present

## 2022-06-24 DIAGNOSIS — F3131 Bipolar disorder, current episode depressed, mild: Secondary | ICD-10-CM | POA: Diagnosis not present

## 2022-07-03 DIAGNOSIS — F3131 Bipolar disorder, current episode depressed, mild: Secondary | ICD-10-CM | POA: Diagnosis not present

## 2022-07-16 DIAGNOSIS — F3131 Bipolar disorder, current episode depressed, mild: Secondary | ICD-10-CM | POA: Diagnosis not present

## 2022-07-28 DIAGNOSIS — F3131 Bipolar disorder, current episode depressed, mild: Secondary | ICD-10-CM | POA: Diagnosis not present

## 2022-08-11 DIAGNOSIS — Z13220 Encounter for screening for lipoid disorders: Secondary | ICD-10-CM | POA: Diagnosis not present

## 2022-08-11 DIAGNOSIS — R7309 Other abnormal glucose: Secondary | ICD-10-CM | POA: Diagnosis not present

## 2022-08-11 DIAGNOSIS — R946 Abnormal results of thyroid function studies: Secondary | ICD-10-CM | POA: Diagnosis not present

## 2022-08-11 DIAGNOSIS — Z79899 Other long term (current) drug therapy: Secondary | ICD-10-CM | POA: Diagnosis not present

## 2022-08-11 DIAGNOSIS — Z125 Encounter for screening for malignant neoplasm of prostate: Secondary | ICD-10-CM | POA: Diagnosis not present

## 2022-08-13 DIAGNOSIS — F3131 Bipolar disorder, current episode depressed, mild: Secondary | ICD-10-CM | POA: Diagnosis not present

## 2022-08-18 DIAGNOSIS — M545 Low back pain, unspecified: Secondary | ICD-10-CM | POA: Diagnosis not present

## 2022-08-18 DIAGNOSIS — Z9109 Other allergy status, other than to drugs and biological substances: Secondary | ICD-10-CM | POA: Diagnosis not present

## 2022-08-18 DIAGNOSIS — F319 Bipolar disorder, unspecified: Secondary | ICD-10-CM | POA: Diagnosis not present

## 2022-08-18 DIAGNOSIS — Z0000 Encounter for general adult medical examination without abnormal findings: Secondary | ICD-10-CM | POA: Diagnosis not present

## 2022-08-18 DIAGNOSIS — R7303 Prediabetes: Secondary | ICD-10-CM | POA: Diagnosis not present

## 2022-08-25 DIAGNOSIS — F3131 Bipolar disorder, current episode depressed, mild: Secondary | ICD-10-CM | POA: Diagnosis not present

## 2022-09-04 DIAGNOSIS — F3131 Bipolar disorder, current episode depressed, mild: Secondary | ICD-10-CM | POA: Diagnosis not present

## 2022-09-15 ENCOUNTER — Encounter: Payer: Self-pay | Admitting: Neurology

## 2022-09-15 ENCOUNTER — Ambulatory Visit (INDEPENDENT_AMBULATORY_CARE_PROVIDER_SITE_OTHER): Payer: BC Managed Care – PPO | Admitting: Neurology

## 2022-09-15 VITALS — BP 143/91 | HR 68 | Ht 77.0 in | Wt 308.0 lb

## 2022-09-15 DIAGNOSIS — R0683 Snoring: Secondary | ICD-10-CM

## 2022-09-15 DIAGNOSIS — G478 Other sleep disorders: Secondary | ICD-10-CM

## 2022-09-15 DIAGNOSIS — R519 Headache, unspecified: Secondary | ICD-10-CM | POA: Diagnosis not present

## 2022-09-15 DIAGNOSIS — J309 Allergic rhinitis, unspecified: Secondary | ICD-10-CM

## 2022-09-15 DIAGNOSIS — R0982 Postnasal drip: Secondary | ICD-10-CM

## 2022-09-15 DIAGNOSIS — E6609 Other obesity due to excess calories: Secondary | ICD-10-CM

## 2022-09-15 DIAGNOSIS — Z6836 Body mass index (BMI) 36.0-36.9, adult: Secondary | ICD-10-CM

## 2022-09-15 NOTE — Progress Notes (Signed)
SLEEP MEDICINE CLINIC    Provider:  Melvyn Novas, MD  Primary Care Physician:     Referring Provider: Irena Reichmann, Do [address] [place],  [place] [address]          Chief Complaint according to patient   Patient presents with:     New Patient (Initial Visit)           HISTORY OF PRESENT ILLNESS:  CALLUM WOLF is a 42 y.o. male patient who is seen upon referral on 09/15/2022 from Irena Reichmann, DO,  for a sleep test.  Chief concern according to patient :  I am heavier than I have ever been and I snore loudly, I have apnoeic events".    ALMALIK WEISSBERG  is seen her on 09/15/22   The patient had no previous sleep studies.    Sleep relevant medical history: Stress induced anxiety and mania- Insomnia. Snoring, apnea. Obesity , Sleep walking on Ambien, allergic rhinitis, tonsils inflamed,     Family medical /sleep history: brother with OSA,. parents alive: healthy.    Social history:  Patient is working as a Engineer, production, and lives in a household alone, has a partner.  Pets are not present. Tobacco use : quit 2015.   ETOH use : 1 day a week,  Caffeine intake in form of Coffee( 2 cups in AM- 1 in PM ) Soda( /) Tea ( /) , some days he uses energy drinks. Exercise: Gym   Sleep habits are as follows: The patient's dinner time is between 5-9 PM. The patient goes to bed at midnight - 1 AM  and continues to sleep for intervals of 2-3 hours, wakes for 0-2 bathroom breaks.   The preferred sleep position is prone , with the support of 4 pillows.  Dreams are reportedly rare. The patient wakes up at 6 with an alarm, some inertia- .7  AM is the usual rise time. He reports not feeling refreshed or restored in AM, with symptoms such as dry mouth, morning headaches, and residual fatigue.  Naps are taken frequently, lasting from 15 to 20 minutes and  these are  refreshing   Review of Systems: Out of a complete 14 system review, the patient complains of only  the following symptoms, and all other reviewed systems are negative.:  Fatigue, sleepiness , snoring, fragmented sleep, Insomnia in the past .  Sleep aid: temazepam.    How likely are you to doze in the following situations: 0 = not likely, 1 = slight chance, 2 = moderate chance, 3 = high chance   Sitting and Reading? Watching Television? Sitting inactive in a public place (theater or meeting)? As a passenger in a car for an hour without a break? Lying down in the afternoon when circumstances permit? Sitting and talking to someone? Sitting quietly after lunch without alcohol? In a car, while stopped for a few minutes in traffic?   Total = 16/ 24 points   FSS endorsed at 42/ 63 points.   Social History   Socioeconomic History   Marital status: Single    Spouse name: Not on file   Number of children: None    Years of education:  Automotive engineer and banking, [employer]-    Highest education level:   Occupational History   Not on file  Tobacco Use   Smoking status: Former    Types: Cigarettes    Quit date: 08/16/2013    Years since quitting: 9.0  Smokeless tobacco: Never  Substance and Sexual Activity   Alcohol use: Yes    Alcohol/week: 1.0 standard drink of alcohol    Types: 1 Glasses of wine per week   Drug use: No   Sexual activity: Not on file  Other Topics Concern   Not on file  Social History Narrative   Not on file   Social Determinants of Health   Financial Resource Strain: Not on file  Food Insecurity: Not on file  Transportation Needs: Not on file  Physical Activity: Not on file  Stress: Not on file  Social Connections: Not on file    Family History  Problem Relation Age of Onset   Throat cancer Mother    Alzheimer's disease Maternal Grandfather     Past Medical History:  Diagnosis Date   Allergy    Bipolar affective disorder, manic (HCC)    last mania epidode 2017    No past surgical history on file.   Current Outpatient Medications on File Prior  to Visit  Medication Sig Dispense Refill   cetirizine (ZYRTEC) 10 MG tablet Take 1 tablet (10 mg total) by mouth daily. 30 tablet 5   fluticasone (FLONASE) 50 MCG/ACT nasal spray Place 2 sprays into both nostrils daily. 16 g 2   meloxicam (MOBIC) 15 MG tablet Take 15 mg by mouth daily.     QUEtiapine (SEROQUEL) 50 MG tablet Take 1 tablet (50 mg total) by mouth at bedtime. 90 tablet 0   tiZANidine (ZANAFLEX) 4 MG tablet Take 4 mg by mouth at bedtime.     traZODone (DESYREL) 150 MG tablet Take 150-300 mg by mouth at bedtime as needed.     VRAYLAR 3 MG capsule Take 3 mg by mouth daily.     zolpidem (AMBIEN) 10 MG tablet Take 1 tablet (10 mg total) by mouth at bedtime as needed for sleep. 30 tablet 1   No current facility-administered medications on file prior to visit.    Allergies  Allergen Reactions   Zyprexa [Olanzapine] Other (See Comments)    Severe drowsiness   Lamictal [Lamotrigine] Rash     DIAGNOSTIC DATA (LABS, IMAGING, TESTING) - I reviewed patient records, labs, notes, testing and imaging myself where available.  Review of the complete blood cell count shows a white blood cell count on 08-11-2022 of 6.1 red blood cell count 4.87, hemoglobin 13.7, hematocrit 41.6, MCV 85.4 MCH 28.1.  The metabolic panel showed a low BUN to creatinine ratio of 8.2.  Albumin was normal liver liver enzymes were in normal range, sodium potassium chloride all in normal range.  Glucose was 87 which would be a normal fasting value.  However the hemoglobin A1c was elevated at 6.0 and that shows a possible insulin resistance.  It would be considered prediabetic.  As to the cholesterol level the total cholesterol was under 200 but the HDL cholesterol only was present for 48 mg LDL cholesterol 149.  Normal PSA, normal TSH, normal urinary specimen, thyroid test :TSH reflex to FT4 was 1.97 in the normal range.      Component Value Date/Time   NA 138 12/19/2014 0835   K 3.9 12/19/2014 0835   CL 103 12/19/2014  0835   CO2 26 12/19/2014 0835   GLUCOSE 83 12/19/2014 0835   BUN 10 12/19/2014 0835   CREATININE 0.94 12/19/2014 0835   CALCIUM 9.0 12/19/2014 0835   PROT 6.8 12/19/2014 0835   ALBUMIN 4.0 12/19/2014 0835   AST 20 12/19/2014 0835   ALT  18 12/19/2014 0835   ALKPHOS 46 12/19/2014 0835   BILITOT 0.5 12/19/2014 0835   GFRNONAA >89 12/19/2014 0835   GFRAA >89 12/19/2014 0835   Lab Results  Component Value Date   CHOL 164 12/19/2014   HDL 47 12/19/2014   LDLCALC 106 12/19/2014   TRIG 57 12/19/2014   CHOLHDL 3.5 12/19/2014   No results found for: "HGBA1C" No results found for: "VITAMINB12" Lab Results  Component Value Date   TSH 1.429 12/19/2014    PHYSICAL EXAM:  Today's Vitals   09/15/22 1011  BP: (!) 143/91  Pulse: 68  Weight: (!) 308 lb (139.7 kg)  Height: 6\' 5"  (1.956 m)   Body mass index is 36.52 kg/m.   Wt Readings from Last 3 Encounters:  09/15/22 (!) 308 lb (139.7 kg)  08/26/17 240 lb (108.9 kg)  10/24/15 246 lb (111.6 kg)     Ht Readings from Last 3 Encounters:  09/15/22 6\' 5"  (1.956 m)  08/26/17 6\' 5"  (1.956 m)  10/24/15 6' 4.5" (1.943 m)      General: The patient is awake, alert and appears not in acute distress. The patient is well groomed. Head: Normocephalic, atraumatic. Neck is supple.  Mallampati 2,  neck circumference:17.5 inches .  Nasal airflow  patent.  Retrognathia is seen.  Dental status: biological, formerly using braces  Cardiovascular:  Regular rate and cardiac rhythm by pulse,  without distended neck veins. Respiratory: Lungs are clear to auscultation.  Skin:  With evidence of ankle edema,  right over left.  Trunk: The patient's posture is erect.   NEUROLOGIC EXAM: The patient is awake and alert, oriented to place and time.   Memory subjective described as intact.  Attention span & concentration ability appears normal.  Speech is fluent,  without  dysarthria, dysphonia or aphasia.  Mood and affect are appropriate.   Cranial  nerves: no loss of smell or taste reported  Pupils are equal and briskly reactive to light.  Funduscopic exam def..  Extraocular movements in vertical and horizontal planes were intact and without nystagmus. No Diplopia. Visual fields by finger perimetry are intact. Hearing was intact to soft voice and finger rubbing.    Facial sensation intact to fine touch.  Facial motor strength is symmetric and tongue and uvula move midline.  Neck ROM : rotation, tilt and flexion extension were normal for age and shoulder shrug was symmetrical.    Motor exam:  Symmetric bulk, tone and ROM.   Normal tone without cog wheeling, symmetric grip strength .   Sensory:  Fine touch, pinprick and vibration were tested  and  normal.  Proprioception tested in the upper extremities was normal.   Coordination: Rapid alternating movements in the fingers/hands were of normal speed.  The Finger-to-nose maneuver was intact without evidence of ataxia, dysmetria or tremor.   Gait and station: Patient could rise unassisted from a seated position, walked without assistive device.  Stance is of normal width/ base .  Toe and heel walk were deferred.  Deep tendon reflexes: in the  upper and lower extremities are symmetric and intact.  Babinski response was deferred .    ASSESSMENT AND PLAN 42 y.o. year old male  here with:    1) I have the pleasure of meeting with Mr. Keylan Costabile. Formisano today, a 42 year old gentleman who has since 2020 gained a significant amount of weight.  This seems to correlate with a less rigorous exercise routine during the pandemic.  His boyfriend of several years  now stated that he has been loud snoring and often leaves the bedroom in order to get rest himself.  He has also witnessed apnea. Subjectively, there is clear evidence of excessive daytime sleepiness with the Epworth sleepiness score being endorsed as high as it was.  Fatigue is not as dominant, but the patient does feel that a power nap.   There is refreshing and something that helps him to get through the day and a more productive fashion.  He does wake up with a dry mouth and occasionally with headaches in the morning so these are signs of possible apnea related hypoxia.  Anatomically his main risk factor is his body mass index while his neck is enlarged it is not a very high neck circumference and his Mallampati is grade 2 and not 3.  I will order a home sleep test for this gentleman as I think that it will suffice in screening him for obstructive sleep apnea.  I would encourage weight loss and reaching a BMI of 30 or less would very likely cure his snoring if he had not been a snorer in 2019 there is no reason to assume that he could not by weight loss alone lose this condition and symptoms.  Insomnia has recently not bothered him, insomnia was strictly related to mood changes in the setting of an underlying mood disorder.   Plan:  HST -   I plan to follow up either personally or through our NP within 3-5 months.   I would like to thank  Irena Reichmann, Do [address] [place],  [place] [address] for allowing me to meet with and to take care of this pleasant patient.   After spending a total time of  35  minutes face to face and additional time for physical and neurologic examination, review of laboratory studies,  personal review of imaging studies, reports and results of other testing and review of referral information / records as far as provided in visit,   Electronically signed by: Melvyn Novas, MD 09/15/2022 10:20 AM  [facility] Board certified by The [employer] of Sleep Medicine and Diplomate of the [employer] of Sleep Medicine. Board certified In Neurology through the ABPN, Fellow of the [employer] of Neurology. Medical Director of [facility].

## 2022-09-15 NOTE — Patient Instructions (Signed)
Quality Sleep Information, Adult Quality sleep is important for your mental and physical health. It also improves your quality of life. Quality sleep means you: Are asleep for most of the time you are in bed. Fall asleep within 30 minutes. Wake up no more than once a night. Are awake for no longer than 20 minutes if you do wake up during the night. Most adults need 7-8 hours of quality sleep each night. How can poor sleep affect me? If you do not get enough quality sleep, you may have: Mood swings. Daytime sleepiness. Decreased alertness, reaction time, and concentration. Sleep disorders, such as insomnia and sleep apnea. Difficulty with: Solving problems. Coping with stress. Paying attention. These issues may affect your performance and productivity at work, school, and home. Lack of sleep may also put you at higher risk for accidents, suicide, and risky behaviors. If you do not get quality sleep, you may also be at higher risk for several health problems, including: Infections. Type 2 diabetes. Heart disease. High blood pressure. Obesity. Worsening of long-term conditions, like arthritis, kidney disease, depression, Parkinson's disease, and epilepsy. What actions can I take to get more quality sleep? Sleep schedule and routine Stick to a sleep schedule. Go to sleep and wake up at about the same time each day. Do not try to sleep less on weekdays and make up for lost sleep on weekends. This does not work. Limit naps during the day to 30 minutes or less. Do not take naps in the late afternoon. Make time to relax before bed. Reading, listening to music, or taking a hot bath promotes quality sleep. Make your bedroom a place that promotes quality sleep. Keep your bedroom dark, quiet, and at a comfortable room temperature. Make sure your bed is comfortable. Avoid using electronic devices that give off bright blue light for 30 minutes before bedtime. Your brain perceives bright blue light  as sunlight. This includes television, phones, and computers. If you are lying awake in bed for longer than 20 minutes, get up and do a relaxing activity until you feel sleepy. Lifestyle     Try to get at least 30 minutes of exercise on most days. Do not exercise 2-3 hours before going to bed. Do not use any products that contain nicotine or tobacco. These products include cigarettes, chewing tobacco, and vaping devices, such as e-cigarettes. If you need help quitting, ask your health care provider. Do not drink caffeinated beverages for at least 8 hours before going to bed. Coffee, tea, and some sodas contain caffeine. Do not drink alcohol or eat large meals close to bedtime. Try to get at least 30 minutes of sunlight every day. Morning sunlight is best. Medical concerns Work with your health care provider to treat medical conditions that may affect sleeping, such as: Nasal obstruction. Snoring. Sleep apnea and other sleep disorders. Talk to your health care provider if you think any of your prescription medicines may cause you to have difficulty falling or staying asleep. If you have sleep problems, talk with a sleep consultant. If you think you have a sleep disorder, talk with your health care provider about getting evaluated by a specialist. Where to find more information Sleep Foundation: sleepfoundation.org American Academy of Sleep Medicine: aasm.org Centers for Disease Control and Prevention (CDC): cdc.gov Contact a health care provider if: You have trouble getting to sleep or staying asleep. You often wake up very early in the morning and cannot get back to sleep. You have daytime sleepiness. You   have daytime sleep attacks of suddenly falling asleep and sudden muscle weakness (narcolepsy). You have a tingling sensation in your legs with a strong urge to move your legs (restless legs syndrome). You stop breathing briefly during sleep (sleep apnea). You think you have a sleep  disorder or are taking a medicine that is affecting your quality of sleep. Summary Most adults need 7-8 hours of quality sleep each night. Getting enough quality sleep is important for your mental and physical health. Make your bedroom a place that promotes quality sleep, and avoid things that may cause you to have poor sleep, such as alcohol, caffeine, smoking, or large meals. Talk to your health care provider if you have trouble falling asleep or staying asleep. This information is not intended to replace advice given to you by your health care provider. Make sure you discuss any questions you have with your health care provider. Document Revised: 08/27/2021 Document Reviewed: 08/27/2021 Elsevier Patient Education  2023 Elsevier Inc. Screening for Sleep Apnea  Sleep apnea is a condition in which breathing pauses or becomes shallow during sleep. Sleep apnea screening is a test to determine if you are at risk for sleep apnea. The test includes a series of questions. It will only takes a few minutes. Your health care provider may ask you to have this test in preparation for surgery or as part of a physical exam. What are the symptoms of sleep apnea? Common symptoms of sleep apnea include: Snoring. Waking up often at night. Daytime sleepiness. Pauses in breathing. Choking or gasping during sleep. Irritability. Forgetfulness. Trouble thinking clearly. Depression. Personality changes. Most people with sleep apnea do not know that they have it. What are the advantages of sleep apnea screening? Getting screened for sleep apnea can help: Ensure your safety. It is important for your health care providers to know whether or not you have sleep apnea, especially if you are having surgery or have other long-term (chronic) health conditions. Improve your health and allow you to get a better night's rest. Restful sleep can help you: Have more energy. Lose weight. Improve high blood  pressure. Improve diabetes management. Prevent stroke. Prevent car accidents. What happens during the screening? Screening usually includes being asked a list of questions about your sleep quality. Some questions you may be asked include: Do you snore? Is your sleep restless? Do you have daytime sleepiness? Has a partner or spouse told you that you stop breathing during sleep? Have you had trouble concentrating or memory loss? What is your age? What is your neck circumference? To measure your neck, keep your back straight and gently wrap the tape measure around your neck. Put the tape measure at the middle of your neck, between your chin and collarbone. What is your sex assigned at birth? Do you have or are you being treated for high blood pressure? If your screening test is positive, you are at risk for the condition. Further testing may be needed to confirm a diagnosis of sleep apnea. Where to find more information You can find screening tools online or at your health care clinic. For more information about sleep apnea screening and healthy sleep, visit these websites: Centers for Disease Control and Prevention: www.cdc.gov American Sleep Apnea Association: www.sleepapnea.org Contact a health care provider if: You think that you may have sleep apnea. Summary Sleep apnea screening can help determine if you are at risk for sleep apnea. It is important for your health care providers to know whether or not you have   sleep apnea, especially if you are having surgery or have other chronic health conditions. You may be asked to take a screening test for sleep apnea in preparation for surgery or as part of a physical exam. This information is not intended to replace advice given to you by your health care provider. Make sure you discuss any questions you have with your health care provider. Document Revised: 04/12/2020 Document Reviewed: 04/12/2020 Elsevier Patient Education  2023 Elsevier  Inc.  

## 2022-09-18 DIAGNOSIS — F3131 Bipolar disorder, current episode depressed, mild: Secondary | ICD-10-CM | POA: Diagnosis not present

## 2022-09-24 DIAGNOSIS — F3131 Bipolar disorder, current episode depressed, mild: Secondary | ICD-10-CM | POA: Diagnosis not present

## 2022-10-01 DIAGNOSIS — F3131 Bipolar disorder, current episode depressed, mild: Secondary | ICD-10-CM | POA: Diagnosis not present

## 2022-10-08 DIAGNOSIS — F3131 Bipolar disorder, current episode depressed, mild: Secondary | ICD-10-CM | POA: Diagnosis not present

## 2022-10-13 ENCOUNTER — Telehealth: Payer: Self-pay | Admitting: Neurology

## 2022-10-13 NOTE — Telephone Encounter (Signed)
HST- [employer] no auth req spoke to [facility] ref # [id number]   Patient is scheduled at [facility] for 10/28/22 at 4 pm.  Mailed packet to the patient.

## 2022-10-20 DIAGNOSIS — F3131 Bipolar disorder, current episode depressed, mild: Secondary | ICD-10-CM | POA: Diagnosis not present

## 2022-10-28 ENCOUNTER — Ambulatory Visit: Payer: BC Managed Care – PPO | Admitting: Neurology

## 2022-10-28 DIAGNOSIS — R519 Headache, unspecified: Secondary | ICD-10-CM

## 2022-10-28 DIAGNOSIS — E6609 Other obesity due to excess calories: Secondary | ICD-10-CM

## 2022-10-28 DIAGNOSIS — G4733 Obstructive sleep apnea (adult) (pediatric): Secondary | ICD-10-CM

## 2022-10-28 DIAGNOSIS — R0683 Snoring: Secondary | ICD-10-CM

## 2022-10-28 DIAGNOSIS — J309 Allergic rhinitis, unspecified: Secondary | ICD-10-CM

## 2022-10-28 DIAGNOSIS — G478 Other sleep disorders: Secondary | ICD-10-CM

## 2022-10-28 DIAGNOSIS — E66812 Obesity, class 2: Secondary | ICD-10-CM

## 2022-10-29 DIAGNOSIS — F3131 Bipolar disorder, current episode depressed, mild: Secondary | ICD-10-CM | POA: Diagnosis not present

## 2022-10-29 NOTE — Progress Notes (Signed)
[facility]  Scott Browning 42 year old male 17-Mar-1981   HOME SLEEP TEST REPORT ( by [facility])   STUDY DATE:  10-29-2022   ORDERING CLINICIAN: Melvyn Novas, MD  REFERRING CLINICIAN: Irena Reichmann, DO   CLINICAL INFORMATION/HISTORY: Scott Browning is a 42 y.o. male patient who is seen upon referral on 09/15/2022 from [facility], DO,  for a sleep test. The patient had no previous sleep studies.    Sleep relevant medical history: Stress induced anxiety and mania- Insomnia. Snoring, apnea. Obesity , Sleep walking on Ambien, allergic rhinitis, tonsils currently inflamed,  relies on TEMAZEPAM.  Chief concern according to patient : BMI 36.5  " I am heavier than I have ever been and I snore loudly, I have apnoeic events, I am sleepy".     Epworth sleepiness score: 16/24.  FSS at 42/ 63 points.    BMI: 36.5 kg/m   Neck Circumference: 17.5"    Sleep Summary:   Total Recording Time (hours, min):    9 hours 43 minutes   Total Sleep Time (hours, min):   7 hours 46 minutes              Percent REM (%):    27.6%                                    Respiratory Indices:   Calculated pAHI (per hour):   49.2/h                          REM pAHI:     47.8/h                                            NREM pAHI:   49.6/h                           Positional AHI:   The patient slept almost exclusively in supine sleep position with an AHI of 49.4/h.  Snoring reached a mean volume of 41 dB and was only recorded for 14% of total sleep time.                                                Oxygen Saturation Statistics:          O2 Saturation Range (%): Between a nadir at 79% with a maximum saturation of 99% with a mean saturation at 93%.                                     O2 Saturation (minutes) <89%:   11.5 minutes        Pulse Rate Statistics:   Pulse Mean (bpm): 77 bpm               Pulse Range:    Varied between 55 and 105 bpm             IMPRESSION:  This  HST confirms the presence of severe and  apparently obstructive sleep apnea associated with frequent brief oxygen desaturations, these cluster in REM sleep.  I am unable to differentiate any positional factors.   RECOMMENDATION: Sleep apnea of this degree and associated with an elevated body mass index needs to be treated with positive airway pressure therapy.  This can be CPAP or BiPAP, usually an auto titration device will be used to initiate treatment. I will order such a device by [employer] with a setting between 5 and 20 cmH2O pressure, 3 cm expiratory pressure relief, heated humidifier and the patient should try interfaces in supine or reclined position to find the most comfortable fit.  I am optimistic that an improvement of apnea can also be followed by a more restorative and refreshing sleep.    INTERPRETING PHYSICIAN:   Melvyn Novas, MD @  [facility]

## 2022-11-01 NOTE — Addendum Note (Signed)
Addended by: Melvyn Novas on: 11/01/2022 11:24 PM   Modules accepted: Orders

## 2022-11-01 NOTE — Procedures (Signed)
[facility]  Scott Browning 42 year old male May 29, 1980   HOME SLEEP TEST REPORT ( by [facility] PAT)   STUDY DATE:  10-29-2022   ORDERING CLINICIAN: Melvyn Novas, MD  REFERRING CLINICIAN: Irena Reichmann, DO   CLINICAL INFORMATION/HISTORY: Scott Browning is a 42 y.o. male patient who is seen upon referral on 09/15/2022 from [facility], [place],  for a sleep test. The patient had no previous sleep studies.    Sleep relevant medical history: Stress induced anxiety and mania- Insomnia. Snoring, apnea. Obesity , Sleep walking on Ambien, allergic rhinitis, tonsils currently inflamed,  relies on TEMAZEPAM.  Chief concern according to patient : BMI 36.5  " I am heavier than I have ever been and I snore loudly, I have apnoeic events, I am sleepy".     Epworth sleepiness score: 16/24.  FSS at 42/ 63 points.    BMI: 36.5 kg/m   Neck Circumference: 17.5"    Sleep Summary:   Total Recording Time (hours, min):    9 hours 43 minutes   Total Sleep Time (hours, min):   7 hours 46 minutes              Percent REM (%):    27.6%                                    Respiratory Indices:   Calculated pAHI (per hour):   49.2/h                          REM pAHI:     47.8/h                                            NREM pAHI:   49.6/h                           Positional AHI:   The patient slept almost exclusively in supine sleep position with an AHI of 49.4/h.  Snoring reached a mean volume of 41 dB and was only recorded for 14% of total sleep time.                                                Oxygen Saturation Statistics:          O2 Saturation Range (%): Between a nadir at 79% with a maximum saturation of 99% with a mean saturation at 93%.                                     O2 Saturation (minutes) <89%:   11.5 minutes        Pulse Rate Statistics:   Pulse Mean (bpm): 77 bpm               Pulse Range:    Varied between 55 and 105 bpm             IMPRESSION:  This  HST confirms the presence of severe and apparently  obstructive sleep apnea associated with frequent brief oxygen desaturations, these cluster in REM sleep.  I am unable to differentiate any positional factors.   RECOMMENDATION: Sleep apnea of this degree and associated with an elevated body mass index needs to be treated with positive airway pressure therapy.  This can be CPAP or BiPAP, usually an auto titration device will be used to initiate treatment. I will order such a device by [employer] with a setting between 5 and 20 cmH2O pressure, 3 cm expiratory pressure relief, heated humidifier and the patient should try interfaces in supine or reclined position to find the most comfortable fit.  I am optimistic that an improvement of apnea can also be followed by a more restorative and refreshing sleep.    INTERPRETING PHYSICIAN:   Melvyn Novas, MD @  [facility].

## 2022-11-02 ENCOUNTER — Telehealth: Payer: Self-pay

## 2022-11-02 NOTE — Telephone Encounter (Signed)
-----   Message from Melvyn Novas, MD sent at 11/01/2022 11:24 PM EDT ----- Severe sleep apnea with some REM sleep hypoxia, urgent CPAP initiation reccommended , setting were spelled out .   Weight loss will be helpful as well.   CC :Please call patient with results.

## 2022-11-02 NOTE — Telephone Encounter (Signed)
I called pt. I advised pt that Dr. Vickey Huger reviewed their sleep study results and found that pt has severe osa. Dr. Vickey Huger recommends that pt start a CPAP. I reviewed PAP compliance expectations with the pt. Pt is agreeable to starting an auto-PAP. I advised pt that an order will be sent to a DME, [facility], and [facility] will call the pt within about one week after they file with the pt's insurance. [facility] will show the pt how to use the machine, fit for masks, and troubleshoot the auto-PAP if needed. A follow up appt was made for insurance purposes with Dr. Vickey Huger on 01/21/2023 at 2:30pm. Pt verbalized understanding to arrive 15 minutes early and bring their auto-PAP.  Pt verbalized understanding of results. Pt had no questions at this time but was encouraged to call back if questions arise. I have sent the order to [facility] and have received confirmation that they have received the order.  Patient would like to get signed up for mychart and I have texted him the link. He is also interested in stopping by this week to pick up a hard copy of this sleep study. I will let our MR coordinator know.

## 2022-11-05 DIAGNOSIS — F3131 Bipolar disorder, current episode depressed, mild: Secondary | ICD-10-CM | POA: Diagnosis not present

## 2022-11-05 NOTE — Telephone Encounter (Signed)
Unfortunetely we do not deal with the cost of the machine. Everyone has their Financial controller and the company processes through insurance. I do know there is a rental fee because that is part of the insurance making sure patient is going to be compliant and I have heard the cost that he describes as prices but again its insurance dependent. I have sent a message to [facility] to see if someone can contact him to provide a better cost break down. Pt can also contact his insurance him self to ask about cost.

## 2022-11-05 NOTE — Telephone Encounter (Signed)
Pt calling to verify if [facility] was to charge an initial start up $250, rental fee $75. Would like a call back.

## 2022-11-12 DIAGNOSIS — F3131 Bipolar disorder, current episode depressed, mild: Secondary | ICD-10-CM | POA: Diagnosis not present

## 2022-11-18 DIAGNOSIS — G4733 Obstructive sleep apnea (adult) (pediatric): Secondary | ICD-10-CM | POA: Diagnosis not present

## 2022-11-20 DIAGNOSIS — F3131 Bipolar disorder, current episode depressed, mild: Secondary | ICD-10-CM | POA: Diagnosis not present

## 2022-11-27 DIAGNOSIS — F3131 Bipolar disorder, current episode depressed, mild: Secondary | ICD-10-CM | POA: Diagnosis not present

## 2022-12-09 ENCOUNTER — Telehealth: Payer: Self-pay | Admitting: Neurology

## 2022-12-09 DIAGNOSIS — R519 Headache, unspecified: Secondary | ICD-10-CM

## 2022-12-09 DIAGNOSIS — R0683 Snoring: Secondary | ICD-10-CM

## 2022-12-09 DIAGNOSIS — G478 Other sleep disorders: Secondary | ICD-10-CM

## 2022-12-09 NOTE — Telephone Encounter (Signed)
Called the patient back. Pt states that he feels the initial pressure is not enough. Upon reviewing the data it appears that his minimum pressure is around 7 cm and so I advised we can increase the minimum pressure from 5 to 7 cm water pressure. Pt also requested the ramp be turned off. Informed him give it couple days and if she still doesn't feel that it is working well, we can try and increase again if needed.

## 2022-12-09 NOTE — Telephone Encounter (Signed)
Pt called stating his air pressure is to low. Requesting call back from nurse.

## 2022-12-17 DIAGNOSIS — F3131 Bipolar disorder, current episode depressed, mild: Secondary | ICD-10-CM | POA: Diagnosis not present

## 2022-12-19 DIAGNOSIS — G4733 Obstructive sleep apnea (adult) (pediatric): Secondary | ICD-10-CM | POA: Diagnosis not present

## 2022-12-23 DIAGNOSIS — G4700 Insomnia, unspecified: Secondary | ICD-10-CM | POA: Diagnosis not present

## 2022-12-23 DIAGNOSIS — G4733 Obstructive sleep apnea (adult) (pediatric): Secondary | ICD-10-CM | POA: Diagnosis not present

## 2022-12-23 DIAGNOSIS — F319 Bipolar disorder, unspecified: Secondary | ICD-10-CM | POA: Diagnosis not present

## 2022-12-23 DIAGNOSIS — Z91128 Patient's intentional underdosing of medication regimen for other reason: Secondary | ICD-10-CM | POA: Diagnosis not present

## 2022-12-24 DIAGNOSIS — F3131 Bipolar disorder, current episode depressed, mild: Secondary | ICD-10-CM | POA: Diagnosis not present

## 2023-01-01 DIAGNOSIS — F3131 Bipolar disorder, current episode depressed, mild: Secondary | ICD-10-CM | POA: Diagnosis not present

## 2023-01-08 DIAGNOSIS — F3131 Bipolar disorder, current episode depressed, mild: Secondary | ICD-10-CM | POA: Diagnosis not present

## 2023-01-19 DIAGNOSIS — G4733 Obstructive sleep apnea (adult) (pediatric): Secondary | ICD-10-CM | POA: Diagnosis not present

## 2023-01-19 DIAGNOSIS — Z79899 Other long term (current) drug therapy: Secondary | ICD-10-CM | POA: Diagnosis not present

## 2023-01-19 DIAGNOSIS — F319 Bipolar disorder, unspecified: Secondary | ICD-10-CM | POA: Diagnosis not present

## 2023-01-19 DIAGNOSIS — R7303 Prediabetes: Secondary | ICD-10-CM | POA: Diagnosis not present

## 2023-01-19 DIAGNOSIS — E781 Pure hyperglyceridemia: Secondary | ICD-10-CM | POA: Diagnosis not present

## 2023-01-21 ENCOUNTER — Encounter: Payer: Self-pay | Admitting: Neurology

## 2023-01-21 ENCOUNTER — Ambulatory Visit (INDEPENDENT_AMBULATORY_CARE_PROVIDER_SITE_OTHER): Payer: BC Managed Care – PPO | Admitting: Neurology

## 2023-01-21 VITALS — BP 118/72 | HR 76 | Ht 77.0 in | Wt 294.0 lb

## 2023-01-21 DIAGNOSIS — R519 Headache, unspecified: Secondary | ICD-10-CM | POA: Diagnosis not present

## 2023-01-21 DIAGNOSIS — J309 Allergic rhinitis, unspecified: Secondary | ICD-10-CM

## 2023-01-21 DIAGNOSIS — F3113 Bipolar disorder, current episode manic without psychotic features, severe: Secondary | ICD-10-CM

## 2023-01-21 DIAGNOSIS — R0982 Postnasal drip: Secondary | ICD-10-CM

## 2023-01-21 DIAGNOSIS — G4733 Obstructive sleep apnea (adult) (pediatric): Secondary | ICD-10-CM | POA: Diagnosis not present

## 2023-01-21 NOTE — Patient Instructions (Addendum)
1) severe OSA dx , non REM sleep dependent. Has lost some weight since last visit and has been started on CPAP  residual AHI is looking good, but compliance is poor.   2) Insomnia and sleep fragmentation are partially to blame for poor compliance - and can be based in bipolar disease.   3) we spoke in detail about improving compliance and using a raised head of bed, a travel or CPAP pillow and taking zyrtec daily to preserve nasal patency. Not eating late at night, reducing alcohol.    I plan to follow up either personally or through our NP within 12 months.   CPAP and BIPAP Information CPAP and BIPAP are methods that use air pressure to keep your airways open and to help you breathe well. CPAP and BIPAP use different amounts of pressure. Your health care provider will tell you whether CPAP or BIPAP would be more helpful for you. CPAP stands for "continuous positive airway pressure." With CPAP, the amount of pressure stays the same while you breathe in (inhale) and out (exhale). BIPAP stands for "bi-level positive airway pressure." With BIPAP, the amount of pressure will be higher when you inhale and lower when you exhale. This allows you to take larger breaths. CPAP or BIPAP may be used in the hospital, or your health care provider may want you to use it at home. You may need to have a sleep study before your health care provider can order a machine for you to use at home. What are the advantages? CPAP or BIPAP can be helpful if you have: Sleep apnea. Chronic obstructive pulmonary disease (COPD). Heart failure. Medical conditions that cause muscle weakness, including muscular dystrophy or amyotrophic lateral sclerosis (ALS). Other problems that cause breathing to be shallow, weak, abnormal, or difficult. CPAP and BIPAP are most commonly used for obstructive sleep apnea (OSA) to keep the airways from collapsing when the muscles relax during sleep. What are the risks? Generally, this is a safe  treatment. However, problems may occur, including: Irritated skin or skin sores if the mask does not fit properly. Dry or stuffy nose or nosebleeds. Dry mouth. Feeling gassy or bloated. Sinus or lung infection if the equipment is not cleaned properly. When should CPAP or BIPAP be used? In most cases, the mask only needs to be worn during sleep. Generally, the mask needs to be worn throughout the night and during any daytime naps. People with certain medical conditions may also need to wear the mask at other times, such as when they are awake. Follow instructions from your health care provider about when to use the machine. What happens during CPAP or BIPAP?  Both CPAP and BIPAP are provided by a small machine with a flexible plastic tube that attaches to a plastic mask that you wear. Air is blown through the mask into your nose or mouth. The amount of pressure that is used to blow the air can be adjusted on the machine. Your health care provider will set the pressure setting and help you find the best mask for you. Tips for using the mask Because the mask needs to be snug, some people feel trapped or closed-in (claustrophobic) when first using the mask. If you feel this way, you may need to get used to the mask. One way to do this is to hold the mask loosely over your nose or mouth and then gradually apply the mask more snugly. You can also gradually increase the amount of time that you  use the mask. Masks are available in various types and sizes. If your mask does not fit well, talk with your health care provider about getting a different one. Some common types of masks include: Full face masks, which fit over the mouth and nose. Nasal masks, which fit over the nose. Nasal pillow or prong masks, which fit into the nostrils. If you are using a mask that fits over your nose and you tend to breathe through your mouth, a chin strap may be applied to help keep your mouth closed. Use a skin barrier to  protect your skin as told by your health care provider. Some CPAP and BIPAP machines have alarms that may sound if the mask comes off or develops a leak. If you have trouble with the mask, it is very important that you talk with your health care provider about finding a way to make the mask easier to tolerate. Do not stop using the mask. There could be a negative impact on your health if you stop using the mask. Tips for using the machine Place your CPAP or BIPAP machine on a secure table or stand near an electrical outlet. Know where the on/off switch is on the machine. Follow instructions from your health care provider about how to set the pressure on your machine and when you should use it. Do not eat or drink while the CPAP or BIPAP machine is on. Food or fluids could get pushed into your lungs by the pressure of the CPAP or BIPAP. For home use, CPAP and BIPAP machines can be rented or purchased through home health care companies. Many different brands of machines are available. Renting a machine before purchasing may help you find out which particular machine works well for you. Your health insurance company may also decide which machine you may get. Keep the CPAP or BIPAP machine and attachments clean. Ask your health care provider for specific instructions. Check the humidifier if you have a dry stuffy nose or nosebleeds. Make sure it is working correctly. Follow these instructions at home: Take over-the-counter and prescription medicines only as told by your health care provider. Ask if you can take sinus medicine if your sinuses are blocked. Do not use any products that contain nicotine or tobacco. These products include cigarettes, chewing tobacco, and vaping devices, such as e-cigarettes. If you need help quitting, ask your health care provider. Keep all follow-up visits. This is important. Contact a health care provider if: You have redness or pressure sores on your head, face, mouth, or  nose from the mask or head gear. You have trouble using the CPAP or BIPAP machine. You cannot tolerate wearing the CPAP or BIPAP mask. Someone tells you that you snore even when wearing your CPAP or BIPAP. Get help right away if: You have trouble breathing. You feel confused. Summary CPAP and BIPAP are methods that use air pressure to keep your airways open and to help you breathe well. If you have trouble with the mask, it is very important that you talk with your health care provider about finding a way to make the mask easier to tolerate. Do not stop using the mask. There could be a negative impact to your health if you stop using the mask. Follow instructions from your health care provider about when to use the machine. This information is not intended to replace advice given to you by your health care provider. Make sure you discuss any questions you have with your health care  provider. Document Revised: 12/11/2020 Document Reviewed: 04/12/2020 Elsevier Patient Education  2023 [employer].

## 2023-01-21 NOTE — Progress Notes (Signed)
Provider:  Melvyn Novas, MD  Primary Care Physician:  Pcp, No No address on file     Referring Provider: Irena Reichmann, DO          Chief Complaint according to patient   Patient presents with:     New on PAP therapy- established sleep Patient (Initial Visit)           HISTORY OF PRESENT ILLNESS:  Scott Browning is a 42 y.o. male patient who is here for revisit 01/21/2023 for  compliance with recently initiated therapy.   Chief concern according to patient :  I am currently out of work due to bipolar illness, stress induced decompensation. I have to make greater effort to use the PAP machine nightly, currently I sleep only about in 2 hours intervals. The home sleep test was performed after the patient had told me that he snored loudly had apneic events witnessed and felt sleepy all day he was also at his heaviest at the time with a BMI of 36.5 and his Epworth sleepiness score was endorsed at 16 points fatigue severity score at 14 2 points his neck circumference was 17.5 inches.  The home sleep test recorded 7 hours 46 minutes of sleep with about 27% REM sleep and an AHI of just below 50/h there was no REM versus non-REM differentiation.  And he slept exclusively in supine.  11.5 minutes of oxygen desaturation time REM noted with a nadir at 79%.    An auto titration machine was ordered for him between 7 and 20 cm water pressure with 3 cm expiratory pressure relief.  His residual AHI is 3.5/h which is a reduction of over 90%.   No central apneas arising the residual obstructive apnea count is 2.5 there is very little air leak the mask fits perfectly the 95th percentile air leak is 1.9 L/min.  The 95th percentile pressure is 13 cm water.   Compliance however has been a difficult problem for him and he only used the machine 60% of the time 18 out of 30 days with an average of 5 hours 17 minutes when used.  I   In order to get him to or use the machine ,which has already shown  some benefits clinically, we discussed today to use a travel pillow which is horse shoe shaped and to also consider a raised head of bed.  This may help him not to move quite as much and not as easily dislodge the mask.   He is using an Airfit F 20 in Large.  He also has to take the machine with him when he spends a night at his partners home.   Since he shared his bipolar condition with me it is also possible that insomnia and frequent sleep interruptions and fragmentation are related to the underlying mental disorder.  He had a sleepwalking episode on Ambien so we are not going to use that medication.    Scott Browning is a 42 y.o. male patient who is seen upon referral on 09/15/2022 from Irena Reichmann, DO,  for a sleep test.  Chief concern according to patient :  I am heavier than I have ever been and I snore loudly, I have apnoeic events".   09/15/22   The patient had no previous sleep studies.    Sleep relevant medical history: Stress induced anxiety and mania- Insomnia. Snoring, apnea. Obesity Sleep walking on Ambien, allergic rhinitis, tonsils inflamed, Family medical /  sleep history: brother with OSA,Both parents alive: healthy.    Social history:  Patient is working as a Engineer, production, and lives in a household alone, has a partner.  Pets are not present. Tobacco use : quit 2015.   ETOH use : 1 day a week,  Caffeine intake in form of Coffee( 2 cups in AM- 1 in PM ) Soda( /) Tea ( /) , some days he uses energy drinks. Exercise: Gym    Sleep habits are as follows: The patient's dinner time is between 5-9 PM. The patient goes to bed at midnight - 1 AM  and continues to sleep for intervals of 2-3 hours, wakes for 0-2 bathroom breaks.   The preferred sleep position is prone , with the support of 4 pillows.  Dreams are reportedly rare. The patient wakes up at 6 with an alarm, some inertia- .7  AM is the usual rise time. He reports not feeling refreshed or restored in AM, with  symptoms such as dry mouth, morning headaches, and residual fatigue.  Naps are taken frequently, lasting from 15 to 20 minutes and  these are  refreshing. Still taking naps but a bit less frequent   Review of Systems: Out of a complete 14 system review, the patient complains of only the following symptoms, and all other reviewed systems are negative.:  Fatigue, sleepiness , snoring,Insomnia, hypersomnia    How likely are you to doze in the following situations: 0 = not likely, 1 = slight chance, 2 = moderate chance, 3 = high chance   Sitting and Reading? Watching Television? Sitting inactive in a public place (theater or meeting)? As a passenger in a car for an hour without a break? Lying down in the afternoon when circumstances permit? Sitting and talking to someone? Sitting quietly after lunch without alcohol? In a car, while stopped for a few minutes in traffic?   Total = 10 from  16 pre CPAP / 24 points   FSS endorsed at 47/ 63 points. Was 42 before CPAP.  Now on wegovy. Now on Zyrtec.   Social History   Socioeconomic History   Marital status: Single    Spouse name: Not on file   Number of children: Not on file   Years of education: Not on file   Highest education level: Not on file  Occupational History   Not on file  Tobacco Use   Smoking status: Former    Current packs/day: 0.00    Types: Cigarettes    Quit date: 08/16/2013    Years since quitting: 9.4   Smokeless tobacco: Never  Substance and Sexual Activity   Alcohol use: Yes    Alcohol/week: 1.0 standard drink of alcohol    Types: 1 Glasses of wine per week   Drug use: No   Sexual activity: Not on file  Other Topics Concern   Not on file  Social History Narrative   Not on file   Social Determinants of Health   Financial Resource Strain: Not on file  Food Insecurity: Not on file  Transportation Needs: Not on file  Physical Activity: Not on file  Stress: Not on file  Social Connections: Not on file     Family History  Problem Relation Age of Onset   Throat cancer Mother    Alzheimer's disease Maternal Grandfather     Past Medical History:  Diagnosis Date   Allergy    Bipolar affective disorder, manic (HCC)    last mania epidode 2017  History reviewed. No pertinent surgical history.   Current Outpatient Medications on File Prior to Visit  Medication Sig Dispense Refill   cetirizine (ZYRTEC) 10 MG tablet Take 1 tablet (10 mg total) by mouth daily. 30 tablet 5   fluticasone (FLONASE) 50 MCG/ACT nasal spray Place 2 sprays into both nostrils daily. 16 g 2   meloxicam (MOBIC) 15 MG tablet Take 15 mg by mouth daily.     QUEtiapine (SEROQUEL) 50 MG tablet Take 1 tablet (50 mg total) by mouth at bedtime. 90 tablet 0   tiZANidine (ZANAFLEX) 4 MG tablet Take 4 mg by mouth at bedtime.     traZODone (DESYREL) 150 MG tablet Take 150-300 mg by mouth at bedtime as needed.     VRAYLAR 3 MG capsule Take 3 mg by mouth daily.     zolpidem (AMBIEN) 10 MG tablet Take 1 tablet (10 mg total) by mouth at bedtime as needed for sleep. 30 tablet 1   No current facility-administered medications on file prior to visit.    Allergies  Allergen Reactions   Zyprexa [Olanzapine] Other (See Comments)    Severe drowsiness   Lamictal [Lamotrigine] Rash     DIAGNOSTIC DATA (LABS, IMAGING, TESTING) - I reviewed patient records, labs, notes, testing and imaging myself where available.  Lab Results  Component Value Date   WBC 4.3 12/19/2014   HGB 13.3 12/19/2014   HCT 38.6 (L) 12/19/2014   MCV 83.4 12/19/2014   PLT 197 12/19/2014      Component Value Date/Time   NA 138 12/19/2014 0835   K 3.9 12/19/2014 0835   CL 103 12/19/2014 0835   CO2 26 12/19/2014 0835   GLUCOSE 83 12/19/2014 0835   BUN 10 12/19/2014 0835   CREATININE 0.94 12/19/2014 0835   CALCIUM 9.0 12/19/2014 0835   PROT 6.8 12/19/2014 0835   ALBUMIN 4.0 12/19/2014 0835   AST 20 12/19/2014 0835   ALT 18 12/19/2014 0835   ALKPHOS  46 12/19/2014 0835   BILITOT 0.5 12/19/2014 0835   GFRNONAA >89 12/19/2014 0835   GFRAA >89 12/19/2014 0835   Lab Results  Component Value Date   CHOL 164 12/19/2014   HDL 47 12/19/2014   LDLCALC 106 12/19/2014   TRIG 57 12/19/2014   CHOLHDL 3.5 12/19/2014   No results found for: "HGBA1C" No results found for: "VITAMINB12" Lab Results  Component Value Date   TSH 1.429 12/19/2014    PHYSICAL EXAM:  Today's Vitals   01/21/23 1443  BP: 118/72  Pulse: 76  Weight: 294 lb (133.4 kg)  Height: 6\' 5"  (1.956 m)   Body mass index is 34.86 kg/m.  LOST 20 pounds since May 2024.   Wt Readings from Last 3 Encounters:  01/21/23 294 lb (133.4 kg)  09/15/22 (!) 308 lb (139.7 kg)  08/26/17 240 lb (108.9 kg)     Ht Readings from Last 3 Encounters:  01/21/23 6\' 5"  (1.956 m)  09/15/22 6\' 5"  (1.956 m)  08/26/17 6\' 5"  (1.956 m)      General: T The patient is awake, alert and appears not in acute distress. The patient is well groomed. Head: Normocephalic, atraumatic. Neck is supple.  Mallampati 2, large inflamed tonsils.  neck circumference:17.25 inches .  Nasal airflow  patent.  Retrognathia is mild.   Dental status: biological, formerly using braces  Cardiovascular:  Regular rate and cardiac rhythm by pulse,  without distended neck veins. Respiratory: Lungs are clear to auscultation.  Skin:  With evidence of  ankle edema,  right over left.  Trunk: The patient's posture is erect.   NEUROLOGIC EXAM: The patient is awake and alert, oriented to place and time.   Memory subjective described as intact.  Attention span & concentration ability appears normal.  Speech is fluent,  without  dysarthria, dysphonia or aphasia.  Mood and affect are appropriate.   Cranial nerves: no loss of smell or taste reported  Pupils are equal and briskly reactive to light.  Funduscopic exam def..  Extraocular movements in vertical and horizontal planes were intact and without nystagmus. No  Diplopia. Visual fields by finger perimetry are intact. Hearing was intact to soft voice and finger rubbing.    Facial sensation intact to fine touch.  Facial motor strength is symmetric and tongue and uvula move midline.  Neck ROM : rotation, tilt and flexion extension were normal for age and shoulder shrug was symmetrical.    Motor exam:  Symmetric bulk, tone and ROM.   Normal tone without cog wheeling, symmetric grip strength .   Sensory:  Fine touch, pinprick and vibration were tested  and  normal.  Proprioception tested in the upper extremities was normal.   Coordination: Rapid alternating movements in the fingers/hands were of normal speed.  The Finger-to-nose maneuver was intact without evidence of ataxia, dysmetria or tremor.   Gait and station: Patient could rise unassisted from a seated position, walked without assistive device.  Stance is of normal width/ base .  Toe and heel walk were deferred.  Deep tendon reflexes: in the  upper and lower extremities are symmetric and intact.  Babinski response was deferred .  ASSESSMENT AND PLAN 42 y.o. male  here with:    1) severe OSA dx , non REM sleep dependent. Has lost some weight since last visit and has been started on CPAP  residual AHI is looking good, but compliance is poor.   2) Insomnia and sleep fragmentation are partially to blame for poor compliance - and can be based in bipolar disease.   3) we spoke in detail about improving compliance and using a raised head of bed, a travel or CPAP pillow and taking zyrtec daily to preserve nasal patency. Not eating late at night, reducing alcohol.    I plan to follow up either personally or through our NP within 12 months.   I would like to thank Irena Reichmann, DO,  for allowing me to meet with and to take care of this pleasant patient.    After spending a total time of  30  minutes face to face and additional time for physical and neurologic examination, review of laboratory  studies,  personal review of imaging studies, reports and results of other testing and review of referral information / records as far as provided in visit,   Electronically signed by: Melvyn Novas, MD 01/21/2023 3:08 PM  [facility] Board certified by The [employer] of [employer] Medicine and Diplomate of the [employer] of [employer]. Board certified In Neurology through the ABPN, Fellow of the [employer].

## 2023-01-22 DIAGNOSIS — F3131 Bipolar disorder, current episode depressed, mild: Secondary | ICD-10-CM | POA: Diagnosis not present

## 2023-01-28 DIAGNOSIS — F3131 Bipolar disorder, current episode depressed, mild: Secondary | ICD-10-CM | POA: Diagnosis not present

## 2023-02-05 ENCOUNTER — Ambulatory Visit: Payer: BC Managed Care – PPO | Admitting: Behavioral Health

## 2023-02-09 DIAGNOSIS — R7303 Prediabetes: Secondary | ICD-10-CM | POA: Diagnosis not present

## 2023-02-09 DIAGNOSIS — Z79899 Other long term (current) drug therapy: Secondary | ICD-10-CM | POA: Diagnosis not present

## 2023-02-09 DIAGNOSIS — E781 Pure hyperglyceridemia: Secondary | ICD-10-CM | POA: Diagnosis not present

## 2023-02-10 DIAGNOSIS — F319 Bipolar disorder, unspecified: Secondary | ICD-10-CM | POA: Diagnosis not present

## 2023-02-10 DIAGNOSIS — F419 Anxiety disorder, unspecified: Secondary | ICD-10-CM | POA: Diagnosis not present

## 2023-02-11 DIAGNOSIS — F3131 Bipolar disorder, current episode depressed, mild: Secondary | ICD-10-CM | POA: Diagnosis not present

## 2023-02-12 DIAGNOSIS — F319 Bipolar disorder, unspecified: Secondary | ICD-10-CM | POA: Diagnosis not present

## 2023-02-12 DIAGNOSIS — F419 Anxiety disorder, unspecified: Secondary | ICD-10-CM | POA: Diagnosis not present

## 2023-02-17 DIAGNOSIS — R946 Abnormal results of thyroid function studies: Secondary | ICD-10-CM | POA: Diagnosis not present

## 2023-02-17 DIAGNOSIS — E781 Pure hyperglyceridemia: Secondary | ICD-10-CM | POA: Diagnosis not present

## 2023-02-17 DIAGNOSIS — Z79899 Other long term (current) drug therapy: Secondary | ICD-10-CM | POA: Diagnosis not present

## 2023-02-17 DIAGNOSIS — Z23 Encounter for immunization: Secondary | ICD-10-CM | POA: Diagnosis not present

## 2023-02-17 DIAGNOSIS — R7303 Prediabetes: Secondary | ICD-10-CM | POA: Diagnosis not present

## 2023-02-18 DIAGNOSIS — F3131 Bipolar disorder, current episode depressed, mild: Secondary | ICD-10-CM | POA: Diagnosis not present

## 2023-02-23 DIAGNOSIS — F419 Anxiety disorder, unspecified: Secondary | ICD-10-CM | POA: Diagnosis not present

## 2023-02-23 DIAGNOSIS — F319 Bipolar disorder, unspecified: Secondary | ICD-10-CM | POA: Diagnosis not present

## 2023-02-24 DIAGNOSIS — Z0271 Encounter for disability determination: Secondary | ICD-10-CM

## 2023-02-25 DIAGNOSIS — F3131 Bipolar disorder, current episode depressed, mild: Secondary | ICD-10-CM | POA: Diagnosis not present

## 2023-03-02 DIAGNOSIS — F319 Bipolar disorder, unspecified: Secondary | ICD-10-CM | POA: Diagnosis not present

## 2023-03-11 DIAGNOSIS — F3131 Bipolar disorder, current episode depressed, mild: Secondary | ICD-10-CM | POA: Diagnosis not present

## 2023-03-16 DIAGNOSIS — F319 Bipolar disorder, unspecified: Secondary | ICD-10-CM | POA: Diagnosis not present

## 2023-03-16 DIAGNOSIS — F419 Anxiety disorder, unspecified: Secondary | ICD-10-CM | POA: Diagnosis not present

## 2023-03-25 DIAGNOSIS — F3131 Bipolar disorder, current episode depressed, mild: Secondary | ICD-10-CM | POA: Diagnosis not present

## 2023-04-01 DIAGNOSIS — F3131 Bipolar disorder, current episode depressed, mild: Secondary | ICD-10-CM | POA: Diagnosis not present

## 2023-04-06 DIAGNOSIS — F319 Bipolar disorder, unspecified: Secondary | ICD-10-CM | POA: Diagnosis not present

## 2023-04-06 DIAGNOSIS — F419 Anxiety disorder, unspecified: Secondary | ICD-10-CM | POA: Diagnosis not present

## 2023-04-08 DIAGNOSIS — F3131 Bipolar disorder, current episode depressed, mild: Secondary | ICD-10-CM | POA: Diagnosis not present

## 2023-04-12 DIAGNOSIS — F419 Anxiety disorder, unspecified: Secondary | ICD-10-CM | POA: Diagnosis not present

## 2023-04-12 DIAGNOSIS — F319 Bipolar disorder, unspecified: Secondary | ICD-10-CM | POA: Diagnosis not present

## 2023-04-12 DIAGNOSIS — M25511 Pain in right shoulder: Secondary | ICD-10-CM | POA: Diagnosis not present

## 2023-04-22 DIAGNOSIS — F3131 Bipolar disorder, current episode depressed, mild: Secondary | ICD-10-CM | POA: Diagnosis not present

## 2023-04-26 DIAGNOSIS — F319 Bipolar disorder, unspecified: Secondary | ICD-10-CM | POA: Diagnosis not present

## 2023-04-26 DIAGNOSIS — F419 Anxiety disorder, unspecified: Secondary | ICD-10-CM | POA: Diagnosis not present

## 2023-04-29 DIAGNOSIS — F3131 Bipolar disorder, current episode depressed, mild: Secondary | ICD-10-CM | POA: Diagnosis not present

## 2023-05-06 DIAGNOSIS — F3131 Bipolar disorder, current episode depressed, mild: Secondary | ICD-10-CM | POA: Diagnosis not present

## 2023-05-14 DIAGNOSIS — F419 Anxiety disorder, unspecified: Secondary | ICD-10-CM | POA: Diagnosis not present

## 2023-05-14 DIAGNOSIS — F319 Bipolar disorder, unspecified: Secondary | ICD-10-CM | POA: Diagnosis not present

## 2023-05-27 DIAGNOSIS — F3131 Bipolar disorder, current episode depressed, mild: Secondary | ICD-10-CM | POA: Diagnosis not present

## 2023-05-28 DIAGNOSIS — F319 Bipolar disorder, unspecified: Secondary | ICD-10-CM | POA: Diagnosis not present

## 2023-05-28 DIAGNOSIS — F419 Anxiety disorder, unspecified: Secondary | ICD-10-CM | POA: Diagnosis not present

## 2023-06-10 DIAGNOSIS — F3131 Bipolar disorder, current episode depressed, mild: Secondary | ICD-10-CM | POA: Diagnosis not present

## 2023-06-21 DIAGNOSIS — F419 Anxiety disorder, unspecified: Secondary | ICD-10-CM | POA: Diagnosis not present

## 2023-06-21 DIAGNOSIS — F319 Bipolar disorder, unspecified: Secondary | ICD-10-CM | POA: Diagnosis not present

## 2023-06-24 DIAGNOSIS — F3131 Bipolar disorder, current episode depressed, mild: Secondary | ICD-10-CM | POA: Diagnosis not present

## 2023-07-15 DIAGNOSIS — F3131 Bipolar disorder, current episode depressed, mild: Secondary | ICD-10-CM | POA: Diagnosis not present

## 2023-07-19 DIAGNOSIS — F319 Bipolar disorder, unspecified: Secondary | ICD-10-CM | POA: Diagnosis not present

## 2023-07-19 DIAGNOSIS — F419 Anxiety disorder, unspecified: Secondary | ICD-10-CM | POA: Diagnosis not present

## 2023-07-29 DIAGNOSIS — F319 Bipolar disorder, unspecified: Secondary | ICD-10-CM | POA: Diagnosis not present

## 2023-07-29 DIAGNOSIS — F419 Anxiety disorder, unspecified: Secondary | ICD-10-CM | POA: Diagnosis not present

## 2023-07-30 ENCOUNTER — Telehealth: Payer: Self-pay | Admitting: Neurology

## 2023-07-30 NOTE — Telephone Encounter (Signed)
 [email or web address] [email or web address] has called to report that in 10 days the disability claim will be reviewed, if additional information needs to be added by provider a confidential phone line can be called which is [phone]

## 2023-08-03 ENCOUNTER — Telehealth: Payer: Self-pay | Admitting: *Deleted

## 2023-08-03 NOTE — Telephone Encounter (Signed)
 I called confidential phone number not able to reach anyone just went to a voice mail.

## 2023-08-11 DIAGNOSIS — F319 Bipolar disorder, unspecified: Secondary | ICD-10-CM | POA: Diagnosis not present

## 2023-08-11 DIAGNOSIS — F419 Anxiety disorder, unspecified: Secondary | ICD-10-CM | POA: Diagnosis not present

## 2023-08-13 DIAGNOSIS — F3131 Bipolar disorder, current episode depressed, mild: Secondary | ICD-10-CM | POA: Diagnosis not present

## 2023-08-18 DIAGNOSIS — E781 Pure hyperglyceridemia: Secondary | ICD-10-CM | POA: Diagnosis not present

## 2023-08-18 DIAGNOSIS — Z79899 Other long term (current) drug therapy: Secondary | ICD-10-CM | POA: Diagnosis not present

## 2023-08-18 DIAGNOSIS — R946 Abnormal results of thyroid function studies: Secondary | ICD-10-CM | POA: Diagnosis not present

## 2023-08-18 DIAGNOSIS — Z125 Encounter for screening for malignant neoplasm of prostate: Secondary | ICD-10-CM | POA: Diagnosis not present

## 2023-08-18 DIAGNOSIS — R7303 Prediabetes: Secondary | ICD-10-CM | POA: Diagnosis not present

## 2023-08-25 DIAGNOSIS — R7303 Prediabetes: Secondary | ICD-10-CM | POA: Diagnosis not present

## 2023-08-25 DIAGNOSIS — F319 Bipolar disorder, unspecified: Secondary | ICD-10-CM | POA: Diagnosis not present

## 2023-08-25 DIAGNOSIS — Z0271 Encounter for disability determination: Secondary | ICD-10-CM | POA: Diagnosis not present

## 2023-08-25 DIAGNOSIS — Z0000 Encounter for general adult medical examination without abnormal findings: Secondary | ICD-10-CM | POA: Diagnosis not present

## 2023-08-25 DIAGNOSIS — G4733 Obstructive sleep apnea (adult) (pediatric): Secondary | ICD-10-CM | POA: Diagnosis not present

## 2024-01-31 ENCOUNTER — Ambulatory Visit: Payer: BC Managed Care – PPO | Admitting: Neurology
